# Patient Record
Sex: Female | Born: 1980
Health system: Southern US, Community
[De-identification: ages and names within clinical notes are randomized; demographics above are authoritative.]

## PROBLEM LIST (undated history)

## (undated) ENCOUNTER — Ambulatory Visit: Admission: EM | Payer: BC Managed Care – PPO | Source: Home / Self Care

## (undated) DIAGNOSIS — R519 Headache, unspecified: Secondary | ICD-10-CM

## (undated) DIAGNOSIS — G932 Benign intracranial hypertension: Secondary | ICD-10-CM

## (undated) DIAGNOSIS — I10 Essential (primary) hypertension: Secondary | ICD-10-CM

## (undated) DIAGNOSIS — F419 Anxiety disorder, unspecified: Secondary | ICD-10-CM

## (undated) DIAGNOSIS — F411 Generalized anxiety disorder: Secondary | ICD-10-CM

## (undated) DIAGNOSIS — F3341 Major depressive disorder, recurrent, in partial remission: Secondary | ICD-10-CM

## (undated) DIAGNOSIS — G43909 Migraine, unspecified, not intractable, without status migrainosus: Secondary | ICD-10-CM

## (undated) DIAGNOSIS — F332 Major depressive disorder, recurrent severe without psychotic features: Secondary | ICD-10-CM

## (undated) DIAGNOSIS — N200 Calculus of kidney: Secondary | ICD-10-CM

## (undated) DIAGNOSIS — N289 Disorder of kidney and ureter, unspecified: Secondary | ICD-10-CM

## (undated) DIAGNOSIS — F410 Panic disorder [episodic paroxysmal anxiety] without agoraphobia: Secondary | ICD-10-CM

## (undated) DIAGNOSIS — Z0000 Encounter for general adult medical examination without abnormal findings: Principal | ICD-10-CM

## (undated) DIAGNOSIS — Z01419 Encounter for gynecological examination (general) (routine) without abnormal findings: Secondary | ICD-10-CM

## (undated) DIAGNOSIS — E876 Hypokalemia: Principal | ICD-10-CM

## (undated) DIAGNOSIS — F330 Major depressive disorder, recurrent, mild: Secondary | ICD-10-CM

## (undated) DIAGNOSIS — F331 Major depressive disorder, recurrent, moderate: Principal | ICD-10-CM

## (undated) HISTORY — PX: ABDOMINAL HYSTERECTOMY: SHX81

## (undated) HISTORY — DX: Calculus of kidney: N20.0

---
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
## 2004-10-20 ENCOUNTER — Ambulatory Visit (HOSPITAL_COMMUNITY): Admission: RE | Admit: 2004-10-20 | Discharge: 2004-10-20 | Payer: Self-pay | Admitting: Family Medicine

## 2004-12-30 ENCOUNTER — Other Ambulatory Visit: Admission: RE | Admit: 2004-12-30 | Discharge: 2004-12-30 | Payer: Self-pay | Admitting: Obstetrics and Gynecology

## 2006-01-10 ENCOUNTER — Other Ambulatory Visit: Admission: RE | Admit: 2006-01-10 | Discharge: 2006-01-10 | Payer: Self-pay | Admitting: Obstetrics and Gynecology

## 2007-08-12 ENCOUNTER — Inpatient Hospital Stay (HOSPITAL_COMMUNITY): Admission: RE | Admit: 2007-08-12 | Discharge: 2007-08-14 | Payer: Self-pay | Admitting: Obstetrics & Gynecology

## 2008-11-18 ENCOUNTER — Inpatient Hospital Stay (HOSPITAL_COMMUNITY): Admission: RE | Admit: 2008-11-18 | Discharge: 2008-11-20 | Payer: Self-pay | Admitting: Obstetrics and Gynecology

## 2008-11-19 ENCOUNTER — Encounter (INDEPENDENT_AMBULATORY_CARE_PROVIDER_SITE_OTHER): Payer: Self-pay | Admitting: Obstetrics & Gynecology

## 2010-10-11 ENCOUNTER — Ambulatory Visit (HOSPITAL_COMMUNITY): Admission: RE | Admit: 2010-10-11 | Discharge: 2010-10-12 | Payer: Self-pay | Admitting: Obstetrics and Gynecology

## 2010-10-11 ENCOUNTER — Encounter (INDEPENDENT_AMBULATORY_CARE_PROVIDER_SITE_OTHER): Payer: Self-pay | Admitting: Obstetrics and Gynecology

## 2011-02-21 LAB — PREGNANCY, URINE: Preg Test, Ur: NEGATIVE

## 2011-02-21 LAB — CBC
HCT: 25.1 % — ABNORMAL LOW (ref 36.0–46.0)
Hemoglobin: 8.7 g/dL — ABNORMAL LOW (ref 12.0–15.0)
MCH: 30.4 pg (ref 26.0–34.0)
MCHC: 34.5 g/dL (ref 30.0–36.0)
MCV: 88 fL (ref 78.0–100.0)
Platelets: 190 10*3/uL (ref 150–400)
RBC: 2.86 MIL/uL — ABNORMAL LOW (ref 3.87–5.11)
RDW: 13.4 % (ref 11.5–15.5)
WBC: 11.5 10*3/uL — ABNORMAL HIGH (ref 4.0–10.5)

## 2011-02-22 LAB — CBC
HCT: 34.7 % — ABNORMAL LOW (ref 36.0–46.0)
Hemoglobin: 11.9 g/dL — ABNORMAL LOW (ref 12.0–15.0)
MCH: 30 pg (ref 26.0–34.0)
MCHC: 34.2 g/dL (ref 30.0–36.0)
MCV: 87.6 fL (ref 78.0–100.0)
Platelets: 248 10*3/uL (ref 150–400)
RBC: 3.96 MIL/uL (ref 3.87–5.11)
RDW: 13.4 % (ref 11.5–15.5)
WBC: 5.4 10*3/uL (ref 4.0–10.5)

## 2011-02-22 LAB — SURGICAL PCR SCREEN
MRSA, PCR: NEGATIVE
Staphylococcus aureus: NEGATIVE

## 2011-04-25 NOTE — Op Note (Signed)
NAMESHANEIKA, ROSSA                  ACCOUNT NO.:  [id number]   MEDICAL RECORD NO.:  [id number]          PATIENT TYPE:  INP   LOCATION:  9130                          FACILITY:  [facility]   PHYSICIAN:  Gerrit Friends. Aldona Bar, M.D.   DATE OF BIRTH:  04-Aug-1981   DATE OF PROCEDURE:  11/18/2008  DATE OF DISCHARGE:                               OPERATIVE REPORT   PREOPERATIVE DIAGNOSIS:  Postpartum, desire for permanent elective  sterilization.   POSTOPERATIVE DIAGNOSIS:  Postpartum, desire for permanent elective  sterilization, pathology pending.   PROCEDURE:  Postpartum tubal sterilization procedure per patient's  request.   SURGEON:  Gerrit Friends. Aldona Bar, MD   ANESTHESIA:  Epidural.   HISTORY:  This 30 year old, gravida 2 now para 2 was induced and  delivered on November 18, 2008, who requested a permanent elective  sterilization procedure.  She is aware that such procedure is meant to  be 100% permanent, but unfortunately it is not 100% perfect.  Subsequent  pregnancy is possible.  According to her wishes, she is now being taken  to the operating room for a postpartum tubal sterilization procedure.   PROCEDURE IN DETAIL:  The patient's epidural was augmented once she  arrived in the operating room and after she was prepped and draped with  a bladder drained with a red rubber catheter in an in-and-out fashion.  Epidural was found to be adequate for anesthesia and procedure was  begun.   A 2-cm subumbilical midline transverse skin incision was made and with  minimal difficulty, dissected down sharply to and through the fascia and  peritoneum.  The dome of the uterus felt normal.  The right ovary and  left ovary likewise felt normal.  The right fallopian tube was  identified and traced out the fimbriated end for positive  identification, and then the midportion of the right fallopian tube.  A  knuckle was elevated and a single tie of #1 plain catgut suture tied  down about the knuckle of tube and  the knuckle was excised and sent to  pathology.  Hemostasis was adequate.  A similar procedure was carried  out on the left fallopian tube.  At this time, with no intra-abdominal  pathology appreciated, closure of the abdomen was begun after all counts  were noted to be correct, and no foreign bodies were noted to be  remained in abdominal cavity.  The abdominal peritoneum was closed with  0 Vicryl in a running fashion.  Fascia was closed with 0 Vicryl in  interrupted fashion, and skin was closed with 4-0 Vicryl in a running  fashion.  Dressing was applied and the patient was transported to the  recovery in satisfactory condition having tolerated the procedure well.  Pathologic specimen consisted of segment of each fallopian tube.  All  counts were correct x2.  Estimated blood loss negligible.      Gerrit Friends. Aldona Bar, M.D.  Electronically Signed    RMW/MEDQ  D:  11/19/2008  T:  11/19/2008  Job:  [id number]

## 2011-09-15 LAB — CBC
HCT: 26.9 % — ABNORMAL LOW (ref 36.0–46.0)
HCT: 33.5 % — ABNORMAL LOW (ref 36.0–46.0)
Hemoglobin: 11.6 g/dL — ABNORMAL LOW (ref 12.0–15.0)
Hemoglobin: 9.4 g/dL — ABNORMAL LOW (ref 12.0–15.0)
MCHC: 34.8 g/dL (ref 30.0–36.0)
MCHC: 35 g/dL (ref 30.0–36.0)
MCV: 89.5 fL (ref 78.0–100.0)
MCV: 91.2 fL (ref 78.0–100.0)
Platelets: 178 10*3/uL (ref 150–400)
Platelets: 213 10*3/uL (ref 150–400)
RBC: 2.95 MIL/uL — ABNORMAL LOW (ref 3.87–5.11)
RBC: 3.74 MIL/uL — ABNORMAL LOW (ref 3.87–5.11)
RDW: 13.3 % (ref 11.5–15.5)
RDW: 13.4 % (ref 11.5–15.5)
WBC: 5.8 10*3/uL (ref 4.0–10.5)
WBC: 9.9 10*3/uL (ref 4.0–10.5)

## 2011-09-15 LAB — RPR: RPR Ser Ql: NONREACTIVE

## 2011-09-15 LAB — CCBB MATERNAL DONOR DRAW

## 2011-09-22 LAB — CBC
HCT: 21.1 — ABNORMAL LOW
HCT: 31.6 — ABNORMAL LOW
Hemoglobin: 10.9 — ABNORMAL LOW
Hemoglobin: 7.5 — CL
MCHC: 34.4
MCHC: 35.4
MCV: 84.1
MCV: 84.3
Platelets: 201
Platelets: 237
RBC: 2.52 — ABNORMAL LOW
RBC: 3.75 — ABNORMAL LOW
RDW: 13.5
RDW: 13.6
WBC: 11.6 — ABNORMAL HIGH
WBC: 6.6

## 2011-09-22 LAB — HEMOGLOBIN AND HEMATOCRIT, BLOOD
HCT: 21.1 — ABNORMAL LOW
Hemoglobin: 7.4 — CL

## 2011-09-22 LAB — RPR: RPR Ser Ql: NONREACTIVE

## 2011-11-08 ENCOUNTER — Other Ambulatory Visit: Payer: Self-pay | Admitting: Obstetrics and Gynecology

## 2015-01-12 ENCOUNTER — Other Ambulatory Visit: Payer: Self-pay | Admitting: Obstetrics and Gynecology

## 2015-01-13 LAB — CYTOLOGY - PAP

## 2019-10-03 ENCOUNTER — Other Ambulatory Visit: Payer: Self-pay

## 2019-10-03 DIAGNOSIS — Z20822 Contact with and (suspected) exposure to covid-19: Secondary | ICD-10-CM

## 2019-10-05 LAB — NOVEL CORONAVIRUS, NAA: SARS-CoV-2, NAA: NOT DETECTED

## 2019-10-22 DIAGNOSIS — Z0000 Encounter for general adult medical examination without abnormal findings: Secondary | ICD-10-CM | POA: Diagnosis not present

## 2019-10-29 DIAGNOSIS — F419 Anxiety disorder, unspecified: Secondary | ICD-10-CM | POA: Diagnosis not present

## 2019-10-29 DIAGNOSIS — K5900 Constipation, unspecified: Secondary | ICD-10-CM | POA: Diagnosis not present

## 2019-10-29 DIAGNOSIS — Z0000 Encounter for general adult medical examination without abnormal findings: Secondary | ICD-10-CM | POA: Diagnosis not present

## 2019-11-17 DIAGNOSIS — K5900 Constipation, unspecified: Secondary | ICD-10-CM | POA: Diagnosis not present

## 2019-11-17 DIAGNOSIS — R1030 Lower abdominal pain, unspecified: Secondary | ICD-10-CM | POA: Diagnosis not present

## 2019-11-17 DIAGNOSIS — Z0000 Encounter for general adult medical examination without abnormal findings: Secondary | ICD-10-CM | POA: Diagnosis not present

## 2019-11-17 DIAGNOSIS — F411 Generalized anxiety disorder: Secondary | ICD-10-CM | POA: Diagnosis not present

## 2019-11-17 DIAGNOSIS — R310 Gross hematuria: Secondary | ICD-10-CM | POA: Diagnosis not present

## 2019-11-17 DIAGNOSIS — F419 Anxiety disorder, unspecified: Secondary | ICD-10-CM | POA: Diagnosis not present

## 2020-01-09 ENCOUNTER — Other Ambulatory Visit: Payer: Self-pay

## 2020-01-09 ENCOUNTER — Ambulatory Visit: Payer: BC Managed Care – PPO | Attending: Internal Medicine

## 2020-01-09 DIAGNOSIS — Z20822 Contact with and (suspected) exposure to covid-19: Secondary | ICD-10-CM | POA: Insufficient documentation

## 2020-01-10 LAB — NOVEL CORONAVIRUS, NAA: SARS-CoV-2, NAA: NOT DETECTED

## 2020-03-08 DIAGNOSIS — N390 Urinary tract infection, site not specified: Secondary | ICD-10-CM | POA: Diagnosis not present

## 2020-03-08 DIAGNOSIS — R310 Gross hematuria: Secondary | ICD-10-CM | POA: Diagnosis not present

## 2020-03-08 DIAGNOSIS — R1030 Lower abdominal pain, unspecified: Secondary | ICD-10-CM | POA: Diagnosis not present

## 2020-06-21 DIAGNOSIS — R310 Gross hematuria: Secondary | ICD-10-CM | POA: Diagnosis not present

## 2020-06-21 DIAGNOSIS — N202 Calculus of kidney with calculus of ureter: Secondary | ICD-10-CM | POA: Diagnosis not present

## 2020-06-21 DIAGNOSIS — R1030 Lower abdominal pain, unspecified: Secondary | ICD-10-CM | POA: Diagnosis not present

## 2020-06-21 DIAGNOSIS — N390 Urinary tract infection, site not specified: Secondary | ICD-10-CM | POA: Diagnosis not present

## 2020-07-21 ENCOUNTER — Other Ambulatory Visit: Payer: Self-pay

## 2020-07-21 ENCOUNTER — Encounter (HOSPITAL_COMMUNITY): Payer: Self-pay

## 2020-07-21 ENCOUNTER — Emergency Department (HOSPITAL_COMMUNITY): Payer: BC Managed Care – PPO

## 2020-07-21 ENCOUNTER — Emergency Department (HOSPITAL_COMMUNITY)
Admission: EM | Admit: 2020-07-21 | Discharge: 2020-07-21 | Disposition: A | Discharge status: Home / Self Care | Payer: BC Managed Care – PPO | Attending: Emergency Medicine | Admitting: Emergency Medicine

## 2020-07-21 DIAGNOSIS — N200 Calculus of kidney: Secondary | ICD-10-CM | POA: Insufficient documentation

## 2020-07-21 DIAGNOSIS — N1330 Unspecified hydronephrosis: Secondary | ICD-10-CM | POA: Diagnosis not present

## 2020-07-21 DIAGNOSIS — N201 Calculus of ureter: Secondary | ICD-10-CM | POA: Diagnosis not present

## 2020-07-21 DIAGNOSIS — N2882 Megaloureter: Secondary | ICD-10-CM | POA: Diagnosis not present

## 2020-07-21 DIAGNOSIS — R1032 Left lower quadrant pain: Secondary | ICD-10-CM | POA: Diagnosis not present

## 2020-07-21 HISTORY — DX: Disorder of kidney and ureter, unspecified: N28.9

## 2020-07-21 LAB — URINALYSIS, ROUTINE W REFLEX MICROSCOPIC
Bilirubin Urine: NEGATIVE
Glucose, UA: NEGATIVE mg/dL
Ketones, ur: NEGATIVE mg/dL
Leukocytes,Ua: NEGATIVE
Nitrite: NEGATIVE
Protein, ur: NEGATIVE mg/dL
Specific Gravity, Urine: 1.014 (ref 1.005–1.030)
pH: 6 (ref 5.0–8.0)

## 2020-07-21 MED ORDER — ONDANSETRON 4 MG PO TBDP
4.0000 mg | ORAL_TABLET | Freq: Once | ORAL | Status: DC
  Filled 2020-07-21: qty 1

## 2020-07-21 MED ORDER — TAMSULOSIN HCL 0.4 MG PO CAPS: 0.4000 mg | ORAL_CAPSULE | Freq: Every day | ORAL | 0 refills | Status: DC

## 2020-07-21 MED ORDER — ONDANSETRON HCL 4 MG PO TABS: 4.0000 mg | ORAL_TABLET | Freq: Three times a day (TID) | ORAL | 0 refills | Status: AC | PRN

## 2020-07-21 MED ORDER — KETOROLAC TROMETHAMINE 10 MG PO TABS: 10.0000 mg | ORAL_TABLET | Freq: Three times a day (TID) | ORAL | 0 refills | Status: AC | PRN

## 2020-07-21 MED ORDER — HYDROCODONE-ACETAMINOPHEN 5-325 MG PO TABS: 2.0000 | ORAL_TABLET | Freq: Four times a day (QID) | ORAL | 0 refills | Status: DC | PRN

## 2020-07-21 NOTE — Discharge Instructions (Signed)
Take Flomax daily to help encourage urination and flush out the kidney stone. Make sure stay well-hydrated water. Take Toradol as needed for mild to moderate pain.  You may add on Norco as needed for severe breakthrough pain.  Have caution, this make you tired or groggy.  Do not drive or operate machinery taking this medicine. You Zofran as needed for nausea or vomiting. Call your urologist that you have an appointment with to discuss your findings today of a large kidney stone.  Ask if you could have a sooner appointment.  If not, you may try the doctor listed below to set up a sooner follow-up. Return to the emergency room if you develop fevers, persistent vomiting, severe worsening pain, inability urinate, or any new, worsening, or concerning symptoms.

## 2020-07-21 NOTE — ED Triage Notes (Signed)
Pt reports has been having frequent kidney stones.  PT says woke up with left sided flank pain.  Took a tramadol around 0800.

## 2020-07-21 NOTE — ED Provider Notes (Signed)
[facility] EMERGENCY DEPARTMENT Provider Note   CSN: [id number] Arrival date & time: 07/21/20  1027     History Chief Complaint  Patient presents with  . Flank Pain    Shelby Parker is a 39 y.o. female presenting for evaluation of left flank pain.  Patient states she woke up at 6:00 this morning with severe left-sided flank pain.  She reports associated nausea and vomiting.  She has a history of similar symptoms about once a month over the past year.  She has been seen by her PCP for this, but had no imaging.  Thought to be due to kidney stones, she has a follow-up appointment with urology next month.  She denies fevers, chills, chest pain, shortness breath, cough, dysuria, hematuria, urinary frequency, or abnormal bowel movements.  She states she takes bupropion daily, no other medical problems or medicines.  She has a history of a partial hysterectomy, no other abdominal surgeries.  She took tramadol for her symptoms, which she states did not help immediately, but states at this time her pain and nausea are much improved from earlier this morning. She denies vaginal bleeding or d/c.    HPI     Past Medical History:  Diagnosis Date  . Renal disorder    kidney stones    There are no problems to display for this patient.   Past Surgical History:  Procedure Laterality Date  . ABDOMINAL HYSTERECTOMY       OB History   No obstetric history on file.     No family history on file.  Social History   Tobacco Use  . Smoking status: Never Smoker  . Smokeless tobacco: Never Used  Substance Use Topics  . Alcohol use: Never  . Drug use: Never    Home Medications Prior to Admission medications   Medication Sig Start Date End Date Taking? Authorizing Provider  buPROPion (WELLBUTRIN SR) 100 MG 12 hr tablet Take 100 mg by mouth every morning. 07/19/20  Yes [provider]  traMADol (ULTRAM) 50 MG tablet Take 50 mg by mouth 2 (two) times daily as needed for severe pain.   06/21/20  Yes [provider]  HYDROcodone-acetaminophen (NORCO/VICODIN) 5-325 MG tablet Take 2 tablets by mouth every 6 (six) hours as needed. 07/21/20   Tahra Hitzeman, PA-C  ketorolac (TORADOL) 10 MG tablet Take 1 tablet (10 mg total) by mouth every 8 (eight) hours as needed. 07/21/20   Rozalyn Osland, PA-C  ondansetron (ZOFRAN) 4 MG tablet Take 1 tablet (4 mg total) by mouth every 8 (eight) hours as needed for nausea or vomiting. 07/21/20   Merrin Mcvicker, PA-C  tamsulosin (FLOMAX) 0.4 MG CAPS capsule Take 1 capsule (0.4 mg total) by mouth daily. 07/21/20   Tywone Bembenek, PA-C    Allergies    Codeine and Hydrocodone  Review of Systems   Review of Systems  Gastrointestinal: Positive for vomiting.  Genitourinary: Positive for flank pain.  All other systems reviewed and are negative.   Physical Exam Updated Vital Signs BP 110/63 (BP Location: Left Arm)   Pulse 83   Temp 98.5 F (36.9 C) (Oral)   Resp 18   Ht 5\' 2"  (1.575 m)   Wt 63.5 kg   SpO2 100%   BMI 25.61 kg/m   Physical Exam Vitals and nursing note reviewed.  Constitutional:      General: She is not in acute distress.    Appearance: She is well-developed.     Comments: Resting in  the bed in no acute distress  HENT:     Head: Normocephalic and atraumatic.  Eyes:     Extraocular Movements: Extraocular movements intact.     Conjunctiva/sclera: Conjunctivae normal.     Pupils: Pupils are equal, round, and reactive to light.  Cardiovascular:     Rate and Rhythm: Normal rate and regular rhythm.     Pulses: Normal pulses.  Pulmonary:     Effort: Pulmonary effort is normal. No respiratory distress.     Breath sounds: Normal breath sounds. No wheezing.  Abdominal:     General: There is no distension.     Palpations: Abdomen is soft. There is no mass.     Tenderness: There is no abdominal tenderness. There is no right CVA tenderness, guarding or rebound.     Comments: No CVA tenderness on my exam,  the patient states when she first arrived to the ER she was tender to palpation of the back/flank.  No anterior abdominal tenderness.  No rigidity, guarding, distention.  Negative rebound.  No peritonitis.  Musculoskeletal:        General: Normal range of motion.     Cervical back: Normal range of motion and neck supple.  Skin:    General: Skin is warm and dry.     Capillary Refill: Capillary refill takes less than 2 seconds.  Neurological:     Mental Status: She is alert and oriented to person, place, and time.     ED Results / Procedures / Treatments   Labs (all labs ordered are listed, but only abnormal results are displayed) Labs Reviewed  URINALYSIS, ROUTINE W REFLEX MICROSCOPIC - Abnormal; Notable for the following components:      Result Value   Hgb urine dipstick MODERATE (*)    Bacteria, UA FEW (*)    All other components within normal limits    EKG None  Radiology CT Renal Stone Study  Result Date: 07/21/2020 CLINICAL DATA:  Left flank pain.  Kidney stones suspected. EXAM: CT ABDOMEN AND PELVIS WITHOUT CONTRAST TECHNIQUE: Multidetector CT imaging of the abdomen and pelvis was performed following the standard protocol without IV contrast. COMPARISON:  None. FINDINGS: Lower chest: Unremarkable Hepatobiliary: No focal abnormality in the liver on this study without intravenous contrast. Possible tiny stone or gallbladder polyp (axial 40/2 and coronal 36/5). No intrahepatic or extrahepatic biliary dilation. Pancreas: No focal mass lesion. No dilatation of the main duct. No intraparenchymal cyst. No peripancreatic edema. Spleen: No splenomegaly. No focal mass lesion. Adrenals/Urinary Tract: No adrenal nodule or mass. 3 x 4 mm nonobstructing stone identified lower pole right kidney. No right ureteral stone. No secondary changes in the right kidney or ureter. No stones are seen in the left kidney although there is mild to moderate left hydroureteronephrosis. Left ureter is dilated down  to the pelvis where a 2 x 4 x 8 mm distal left ureteral stone is identified (well demonstrated coronal 57/5 and visible on axial 74/2). Bladder is nondistended. Stomach/Bowel: Stomach is unremarkable. No gastric wall thickening. No evidence of outlet obstruction. Duodenum is normally positioned as is the ligament of Treitz. No small bowel wall thickening. No small bowel dilatation. No gross colonic mass. No colonic wall thickening. Vascular/Lymphatic: No abdominal aortic aneurysm. No abdominal aortic atherosclerotic calcification. There is no gastrohepatic or hepatoduodenal ligament lymphadenopathy. No retroperitoneal or mesenteric lymphadenopathy. No pelvic sidewall lymphadenopathy. Reproductive: Unremarkable. Other: No intraperitoneal free fluid. Musculoskeletal: No worrisome lytic or sclerotic osseous abnormality. IMPRESSION: 1. 2 x 4 x  8 mm distal left ureteral stone with mild to moderate left hydroureteronephrosis. 2. 3 x 4 mm nonobstructing stone lower pole right kidney. 3. Possible tiny stone or gallbladder polyp. Electronically Signed   By: Kennith Center M.D.   On: 07/21/2020 11:44    Procedures Procedures (including critical care time)  Medications Ordered in ED Medications - No data to display  ED Course  I have reviewed the triage vital signs and the nursing notes.  Pertinent labs & imaging results that were available during my care of the patient were reviewed by me and considered in my medical decision making (see chart for details).    MDM Rules/Calculators/A&P                          Patient presented for evaluation of left leg pain and vomiting.  On exam, patient was nontoxic.  Symptoms have mostly resolved by the time I saw patient.  She has a history of similar, but no imaging.  Likely kidney stone.  Also consider Pilo, though less likely without signs of infection.  Consider GI illness.  Will obtain CT renal, urine, and reassess.  CT renal shows stone in the left ureter with  mild hydro.  Urine without signs of infection.  Patient remains symptom-free on my repeat evaluation.  I discussed findings with patient.  Discussed Intermatic treatment as needed.  Encourage patient to follow-up with urology sooner than next month, as patient stone is up to 8 mm, may not pass.  Discussed return with signs of infection.  At this time, patient appears safe for discharge.  Return precautions given.  Patient states she understands and agrees to plan.  Final Clinical Impression(s) / ED Diagnoses Final diagnoses:  Kidney stone on left side    Rx / DC Orders ED Discharge Orders         Ordered    HYDROcodone-acetaminophen (NORCO/VICODIN) 5-325 MG tablet  Every 6 hours PRN     Discontinue  Reprint     07/21/20 1348    ketorolac (TORADOL) 10 MG tablet  Every 8 hours PRN     Discontinue  Reprint     07/21/20 1348    ondansetron (ZOFRAN) 4 MG tablet  Every 8 hours PRN     Discontinue  Reprint     07/21/20 1348    tamsulosin (FLOMAX) 0.4 MG CAPS capsule  Daily     Discontinue  Reprint     07/21/20 1349           Saira Kramme, PA-C 07/21/20 1408    Geoffery Lyons, MD 07/21/20 1501

## 2020-07-22 ENCOUNTER — Other Ambulatory Visit (HOSPITAL_COMMUNITY): Payer: BC Managed Care – PPO

## 2020-07-22 ENCOUNTER — Ambulatory Visit (INDEPENDENT_AMBULATORY_CARE_PROVIDER_SITE_OTHER): Payer: BC Managed Care – PPO | Admitting: Urology

## 2020-07-22 ENCOUNTER — Other Ambulatory Visit: Payer: Self-pay

## 2020-07-22 ENCOUNTER — Other Ambulatory Visit: Payer: Self-pay | Admitting: Urology

## 2020-07-22 ENCOUNTER — Encounter: Payer: Self-pay | Admitting: Urology

## 2020-07-22 ENCOUNTER — Ambulatory Visit (HOSPITAL_COMMUNITY)
Admission: RE | Admit: 2020-07-22 | Discharge: 2020-07-22 | Disposition: A | Discharge status: Home / Self Care | Payer: BC Managed Care – PPO | Source: Ambulatory Visit | Attending: Urology | Admitting: Urology

## 2020-07-22 VITALS — BP 101/68 | HR 76 | Temp 98.7°F | Wt 140.0 lb

## 2020-07-22 DIAGNOSIS — N200 Calculus of kidney: Secondary | ICD-10-CM | POA: Diagnosis not present

## 2020-07-22 DIAGNOSIS — I878 Other specified disorders of veins: Secondary | ICD-10-CM | POA: Diagnosis not present

## 2020-07-22 DIAGNOSIS — Z87442 Personal history of urinary calculi: Secondary | ICD-10-CM | POA: Diagnosis not present

## 2020-07-22 LAB — URINALYSIS, ROUTINE W REFLEX MICROSCOPIC
Bilirubin, UA: NEGATIVE
Glucose, UA: NEGATIVE
Ketones, UA: NEGATIVE
Leukocytes,UA: NEGATIVE
Nitrite, UA: NEGATIVE
Protein,UA: NEGATIVE
Specific Gravity, UA: 1.025 (ref 1.005–1.030)
Urobilinogen, Ur: 0.2 mg/dL (ref 0.2–1.0)
pH, UA: 5.5 (ref 5.0–7.5)

## 2020-07-22 LAB — MICROSCOPIC EXAMINATION
Epithelial Cells (non renal): >10 /hpf — AB (ref 0–10)
Renal Epithel, UA: NONE SEEN /hpf

## 2020-07-22 MED ORDER — TAMSULOSIN HCL 0.4 MG PO CAPS: 0.4000 mg | ORAL_CAPSULE | Freq: Every day | ORAL | 0 refills | Status: AC

## 2020-07-22 MED ORDER — OXYCODONE-ACETAMINOPHEN 5-325 MG PO TABS: 1.0000 | ORAL_TABLET | ORAL | 0 refills | Status: AC | PRN

## 2020-07-22 NOTE — Progress Notes (Signed)
Patient to arrive at 0915 on 07/26/2020. History and medications reviewed. All pre-procedure instructions given.  Instructed to stop toradol 48 hours prior to procedure.  NPO after MN. Driver secured.

## 2020-07-22 NOTE — H&P (View-Only) (Signed)
° °07/22/2020 °9:36 AM  ° °Emberlie C Reidel °03/05/1981 °[id number] ° °Referring provider: Hall, John Z, MD °[address] °[address] °[place],  [place] [address] ° °Left flank pain ° °HPI: °Ms Shelby Parker is a 39yo here for evaluation of nephrolithiasis.  °1 month she developed left flank pain and was diagnosed with a left ureteral calculus. She then developed severe left flank pain and presented to the ER and was diagnosed with a 9mm left mid ureteral calculus. Her first stone event was 5 years ago. Currently she has mild left flank pain with occasional nausea.  ° °PMH: °Past Medical History:  °Diagnosis Date  °• Nephrolithiasis   °• Renal disorder   ° kidney stones  ° ° °Surgical History: °Past Surgical History:  °Procedure Laterality Date  °• ABDOMINAL HYSTERECTOMY    ° ° °Home Medications:  °Allergies as of 07/22/2020   °   Reactions  ° Codeine   ° Hydrocodone   °  °  °Medication List  °  °  ° Accurate as of July 22, 2020  9:36 AM. If you have any questions, ask your nurse or doctor.  °  °  °  °buPROPion 100 MG 12 hr tablet °Commonly known as: WELLBUTRIN SR °Take 100 mg by mouth every morning. °  °HYDROcodone-acetaminophen 5-325 MG tablet °Commonly known as: NORCO/VICODIN °Take 2 tablets by mouth every 6 (six) hours as needed. °  °ketorolac 10 MG tablet °Commonly known as: TORADOL °Take 1 tablet (10 mg total) by mouth every 8 (eight) hours as needed. °  °ondansetron 4 MG tablet °Commonly known as: ZOFRAN °Take 1 tablet (4 mg total) by mouth every 8 (eight) hours as needed for nausea or vomiting. °  °tamsulosin 0.4 MG Caps capsule °Commonly known as: Flomax °Take 1 capsule (0.4 mg total) by mouth daily. °  °traMADol 50 MG tablet °Commonly known as: ULTRAM °Take 50 mg by mouth 2 (two) times daily as needed for severe pain. °  °  ° ° °Allergies:  °Allergies  °Allergen Reactions  °• Codeine   °• Hydrocodone   ° ° °Family History: °History reviewed. No pertinent family history. ° °Social History:  reports that she has never smoked.  She has never used smokeless tobacco. She reports that she does not drink alcohol and does not use drugs. ° °ROS: °All other review of systems were reviewed and are negative except what is noted above in HPI ° °Physical Exam: °BP 101/68    Pulse 76    Temp 98.7 °F (37.1 °C)    Wt 140 lb (63.5 kg)    BMI 25.61 kg/m²   °Constitutional:  Alert and oriented, No acute distress. °HEENT: [place] AT, moist mucus membranes.  Trachea midline, no masses. °Cardiovascular: No clubbing, cyanosis, or edema. °Respiratory: Normal respiratory effort, no increased work of breathing. °GI: Abdomen is soft, nontender, nondistended, no abdominal masses °GU: No CVA tenderness.  °Lymph: No cervical or inguinal lymphadenopathy. °Skin: No rashes, bruises or suspicious lesions. °Neurologic: Grossly intact, no focal deficits, moving all 4 extremities. °Psychiatric: Normal mood and affect. ° °Laboratory Data: °Lab Results  °Component Value Date  ° WBC 11.5 (H) 10/12/2010  ° HGB 8.7 (L) 10/12/2010  ° HCT 25.1 (L) 10/12/2010  ° MCV 88.0 10/12/2010  ° PLT 190 10/12/2010  ° ° °No results found for: CREATININE ° °No results found for: PSA ° °No results found for: TESTOSTERONE ° °No results found for: HGBA1C ° °Urinalysis °   °Component Value Date/Time  °   COLORURINE YELLOW 07/21/2020 1312  ° APPEARANCEUR CLEAR 07/21/2020 1312  ° LABSPEC 1.014 07/21/2020 1312  ° PHURINE 6.0 07/21/2020 1312  ° GLUCOSEU NEGATIVE 07/21/2020 1312  ° HGBUR MODERATE (A) 07/21/2020 1312  ° BILIRUBINUR NEGATIVE 07/21/2020 1312  ° KETONESUR NEGATIVE 07/21/2020 1312  ° PROTEINUR NEGATIVE 07/21/2020 1312  ° NITRITE NEGATIVE 07/21/2020 1312  ° LEUKOCYTESUR NEGATIVE 07/21/2020 1312  ° ° °Lab Results  °Component Value Date  ° BACTERIA FEW (A) 07/21/2020  ° ° °Pertinent Imaging: °Ct stone study 07/21/2020: Images reviewed and discussed with the patient.  °No results found for this or any previous visit. ° °No results found for this or any previous visit. ° °No results found for this or any  previous visit. ° °No results found for this or any previous visit. ° °No results found for this or any previous visit. ° °No results found for this or any previous visit. ° °No results found for this or any previous visit. ° °Results for orders placed during the hospital encounter of 07/21/20 ° °CT Renal Stone Study ° °Narrative °CLINICAL DATA:  Left flank pain.  Kidney stones suspected. ° °EXAM: °CT ABDOMEN AND PELVIS WITHOUT CONTRAST ° °TECHNIQUE: °Multidetector CT imaging of the abdomen and pelvis was performed °following the standard protocol without IV contrast. ° °COMPARISON:  None. ° °FINDINGS: °Lower chest: Unremarkable ° °Hepatobiliary: No focal abnormality in the liver on this study °without intravenous contrast. Possible tiny stone or gallbladder °polyp (axial 40/2 and coronal 36/5). No intrahepatic or extrahepatic °biliary dilation. ° °Pancreas: No focal mass lesion. No dilatation of the main duct. No °intraparenchymal cyst. No peripancreatic edema. ° °Spleen: No splenomegaly. No focal mass lesion. ° °Adrenals/Urinary Tract: No adrenal nodule or mass. 3 x 4 mm °nonobstructing stone identified lower pole right kidney. No right °ureteral stone. No secondary changes in the right kidney or ureter. °No stones are seen in the left kidney although there is mild to °moderate left hydroureteronephrosis. Left ureter is dilated down to °the pelvis where a 2 x 4 x 8 mm distal left ureteral stone is °identified (well demonstrated coronal 57/5 and visible on axial °74/2). Bladder is nondistended. ° °Stomach/Bowel: Stomach is unremarkable. No gastric wall thickening. °No evidence of outlet obstruction. Duodenum is normally positioned °as is the ligament of Treitz. No small bowel wall thickening. No °small bowel dilatation. No gross colonic mass. No colonic wall °thickening. ° °Vascular/Lymphatic: No abdominal aortic aneurysm. No abdominal °aortic atherosclerotic calcification. There is no gastrohepatic  or °hepatoduodenal ligament lymphadenopathy. No retroperitoneal or °mesenteric lymphadenopathy. No pelvic sidewall lymphadenopathy. ° °Reproductive: Unremarkable. ° °Other: No intraperitoneal free fluid. ° °Musculoskeletal: No worrisome lytic or sclerotic osseous °abnormality. ° °IMPRESSION: °1. 2 x 4 x 8 mm distal left ureteral stone with mild to moderate °left hydroureteronephrosis. °2. 3 x 4 mm nonobstructing stone lower pole right kidney. °3. Possible tiny stone or gallbladder polyp. ° ° °Electronically Signed °By: Eric  Mansell M.D. °On: 07/21/2020 11:44 ° ° °Assessment & Plan:   ° °1. Nephrolithiasis °-We discussed the management of kidney stones. These options include observation, ureteroscopy, shockwave lithotripsy (ESWL) and percutaneous nephrolithotomy (PCNL). We discussed which options are relevant to the patient's stone(s). We discussed the natural history of kidney stones as well as the complications of untreated stones and the impact on quality of life without treatment as well as with each of the above listed treatments. We also discussed the efficacy of each treatment in its ability to clear the stone burden. With any of   these management options I discussed the signs and symptoms of infection and the need for emergent treatment should these be experienced. For each option we discussed the ability of each procedure to clear the patient of their stone burden.  ° °For observation I described the risks which include but are not limited to silent renal damage, life-threatening infection, need for emergent surgery, failure to pass stone and pain.  ° °For ureteroscopy I described the risks which include bleeding, infection, damage to contiguous structures, positioning injury, ureteral stricture, ureteral avulsion, ureteral injury, need for prolonged ureteral stent, inability to perform ureteroscopy, need for an interval procedure, inability to clear stone burden, stent discomfort/pain, heart attack, stroke,  pulmonary embolus and the inherent risks with general anesthesia.  ° °For shockwave lithotripsy I described the risks which include arrhythmia, kidney contusion, kidney hemorrhage, need for transfusion, pain, inability to adequately break up stone, inability to pass stone fragments, Steinstrasse, infection associated with obstructing stones, need for alternate surgical procedure, need for repeat shockwave lithotripsy, MI, CVA, PE and the inherent risks with anesthesia/conscious sedation.  ° °For PCNL I described the risks including positioning injury, pneumothorax, hydrothorax, need for chest tube, inability to clear stone burden, renal laceration, arterial venous fistula or malformation, need for embolization of kidney, loss of kidney or renal function, need for repeat procedure, need for prolonged nephrostomy tube, ureteral avulsion, MI, CVA, PE and the inherent risks of general anesthesia.  ° °- The patient would like to proceed with ESWL. Rx for percocet, flomax given  °- Urinalysis, Routine w reflex microscopic ° ° °No follow-ups on file. ° °Jarrah Babich, MD ° °[facility] ° °

## 2020-07-22 NOTE — Progress Notes (Signed)
07/22/2020 9:36 AM   Shelby Parker 01/10/1981 [id number]  Referring provider: Benita Stabile, MD [address] [name],  [place] [address]  Left flank pain  HPI: [name] is a 39yo here for evaluation of nephrolithiasis.  1 month she developed left flank pain and was diagnosed with a left ureteral calculus. She then developed severe left flank pain and presented to the ER and was diagnosed with a 39mm left mid ureteral calculus. Her first stone event was 5 years ago. Currently she has mild left flank pain with occasional nausea.   PMH: Past Medical History:  Diagnosis Date   Nephrolithiasis    Renal disorder    kidney stones    Surgical History: Past Surgical History:  Procedure Laterality Date   ABDOMINAL HYSTERECTOMY      Home Medications:  Allergies as of 07/22/2020      Reactions   Codeine    Hydrocodone       Medication List       Accurate as of July 22, 2020  9:36 AM. If you have any questions, ask your nurse or doctor.        buPROPion 100 MG 12 hr tablet Commonly known as: WELLBUTRIN SR Take 100 mg by mouth every morning.   HYDROcodone-acetaminophen 5-325 MG tablet Commonly known as: NORCO/VICODIN Take 2 tablets by mouth every 6 (six) hours as needed.   ketorolac 10 MG tablet Commonly known as: TORADOL Take 1 tablet (10 mg total) by mouth every 8 (eight) hours as needed.   ondansetron 4 MG tablet Commonly known as: ZOFRAN Take 1 tablet (4 mg total) by mouth every 8 (eight) hours as needed for nausea or vomiting.   tamsulosin 0.4 MG Caps capsule Commonly known as: Flomax Take 1 capsule (0.4 mg total) by mouth daily.   traMADol 50 MG tablet Commonly known as: ULTRAM Take 50 mg by mouth 2 (two) times daily as needed for severe pain.       Allergies:  Allergies  Allergen Reactions   Codeine    Hydrocodone     Family History: History reviewed. No pertinent family history.  Social History:  reports that she has never smoked.  She has never used smokeless tobacco. She reports that she does not drink alcohol and does not use drugs.  ROS: All other review of systems were reviewed and are negative except what is noted above in HPI  Physical Exam: BP 101/68    Pulse 76    Temp 98.7 F (37.1 C)    Wt 140 lb (63.5 kg)    BMI 25.61 kg/m   Constitutional:  Alert and oriented, No acute distress. HEENT: [place] AT, moist mucus membranes.  Trachea midline, no masses. Cardiovascular: No clubbing, cyanosis, or edema. Respiratory: Normal respiratory effort, no increased work of breathing. GI: Abdomen is soft, nontender, nondistended, no abdominal masses GU: No CVA tenderness.  Lymph: No cervical or inguinal lymphadenopathy. Skin: No rashes, bruises or suspicious lesions. Neurologic: Grossly intact, no focal deficits, moving all 4 extremities. Psychiatric: Normal mood and affect.  Laboratory Data: Lab Results  Component Value Date   WBC 11.5 (H) 10/12/2010   HGB 8.7 (L) 10/12/2010   HCT 25.1 (L) 10/12/2010   MCV 88.0 10/12/2010   PLT 190 10/12/2010    No results found for: CREATININE  No results found for: PSA  No results found for: TESTOSTERONE  No results found for: HGBA1C  Urinalysis    Component Value Date/Time  COLORURINE YELLOW 07/21/2020 1312   APPEARANCEUR CLEAR 07/21/2020 1312   LABSPEC 1.014 07/21/2020 1312   PHURINE 6.0 07/21/2020 1312   GLUCOSEU NEGATIVE 07/21/2020 1312   HGBUR MODERATE (A) 07/21/2020 1312   BILIRUBINUR NEGATIVE 07/21/2020 1312   KETONESUR NEGATIVE 07/21/2020 1312   PROTEINUR NEGATIVE 07/21/2020 1312   NITRITE NEGATIVE 07/21/2020 1312   LEUKOCYTESUR NEGATIVE 07/21/2020 1312    Lab Results  Component Value Date   BACTERIA FEW (A) 07/21/2020    Pertinent Imaging: Ct stone study 07/21/2020: Images reviewed and discussed with the patient.  No results found for this or any previous visit.  No results found for this or any previous visit.  No results found for this or any  previous visit.  No results found for this or any previous visit.  No results found for this or any previous visit.  No results found for this or any previous visit.  No results found for this or any previous visit.  Results for orders placed during the hospital encounter of 07/21/20  CT Renal Stone Study  Narrative CLINICAL DATA:  Left flank pain.  Kidney stones suspected.  EXAM: CT ABDOMEN AND PELVIS WITHOUT CONTRAST  TECHNIQUE: Multidetector CT imaging of the abdomen and pelvis was performed following the standard protocol without IV contrast.  COMPARISON:  None.  FINDINGS: Lower chest: Unremarkable  Hepatobiliary: No focal abnormality in the liver on this study without intravenous contrast. Possible tiny stone or gallbladder polyp (axial 40/2 and coronal 36/5). No intrahepatic or extrahepatic biliary dilation.  Pancreas: No focal mass lesion. No dilatation of the main duct. No intraparenchymal cyst. No peripancreatic edema.  Spleen: No splenomegaly. No focal mass lesion.  Adrenals/Urinary Tract: No adrenal nodule or mass. 3 x 4 mm nonobstructing stone identified lower pole right kidney. No right ureteral stone. No secondary changes in the right kidney or ureter. No stones are seen in the left kidney although there is mild to moderate left hydroureteronephrosis. Left ureter is dilated down to the pelvis where a 2 x 4 x 8 mm distal left ureteral stone is identified (well demonstrated coronal 57/5 and visible on axial 74/2). Bladder is nondistended.  Stomach/Bowel: Stomach is unremarkable. No gastric wall thickening. No evidence of outlet obstruction. Duodenum is normally positioned as is the ligament of Treitz. No small bowel wall thickening. No small bowel dilatation. No gross colonic mass. No colonic wall thickening.  Vascular/Lymphatic: No abdominal aortic aneurysm. No abdominal aortic atherosclerotic calcification. There is no gastrohepatic  or hepatoduodenal ligament lymphadenopathy. No retroperitoneal or mesenteric lymphadenopathy. No pelvic sidewall lymphadenopathy.  Reproductive: Unremarkable.  Other: No intraperitoneal free fluid.  Musculoskeletal: No worrisome lytic or sclerotic osseous abnormality.  IMPRESSION: 1. 2 x 4 x 8 mm distal left ureteral stone with mild to moderate left hydroureteronephrosis. 2. 3 x 4 mm nonobstructing stone lower pole right kidney. 3. Possible tiny stone or gallbladder polyp.   Electronically Signed By: Kennith Center M.D. On: 07/21/2020 11:44   Assessment & Plan:    1. Nephrolithiasis -We discussed the management of kidney stones. These options include observation, ureteroscopy, shockwave lithotripsy (ESWL) and percutaneous nephrolithotomy (PCNL). We discussed which options are relevant to the patient's stone(s). We discussed the natural history of kidney stones as well as the complications of untreated stones and the impact on quality of life without treatment as well as with each of the above listed treatments. We also discussed the efficacy of each treatment in its ability to clear the stone burden. With any of  these management options I discussed the signs and symptoms of infection and the need for emergent treatment should these be experienced. For each option we discussed the ability of each procedure to clear the patient of their stone burden.   For observation I described the risks which include but are not limited to silent renal damage, life-threatening infection, need for emergent surgery, failure to pass stone and pain.   For ureteroscopy I described the risks which include bleeding, infection, damage to contiguous structures, positioning injury, ureteral stricture, ureteral avulsion, ureteral injury, need for prolonged ureteral stent, inability to perform ureteroscopy, need for an interval procedure, inability to clear stone burden, stent discomfort/pain, heart attack, stroke,  pulmonary embolus and the inherent risks with general anesthesia.   For shockwave lithotripsy I described the risks which include arrhythmia, kidney contusion, kidney hemorrhage, need for transfusion, pain, inability to adequately break up stone, inability to pass stone fragments, Steinstrasse, infection associated with obstructing stones, need for alternate surgical procedure, need for repeat shockwave lithotripsy, MI, CVA, PE and the inherent risks with anesthesia/conscious sedation.   For PCNL I described the risks including positioning injury, pneumothorax, hydrothorax, need for chest tube, inability to clear stone burden, renal laceration, arterial venous fistula or malformation, need for embolization of kidney, loss of kidney or renal function, need for repeat procedure, need for prolonged nephrostomy tube, ureteral avulsion, MI, CVA, PE and the inherent risks of general anesthesia.   - The patient would like to proceed with ESWL. Rx for percocet, flomax given  - Urinalysis, Routine w reflex microscopic   No follow-ups on file.  Wilkie Aye, MD  [facility] [place]

## 2020-07-22 NOTE — Patient Instructions (Signed)
Lithotripsy  Lithotripsy is a treatment that can sometimes help eliminate kidney stones and the pain that they cause. A form of lithotripsy, also known as extracorporeal shock wave lithotripsy, is a nonsurgical procedure that crushes a kidney stone with shock waves. These shock waves pass through your body and focus on the kidney stone. They cause the kidney stone to break up while it is still in the urinary tract. This makes it easier for the smaller pieces of stone to pass in the urine. Tell a health care provider about:  Any allergies you have.  All medicines you are taking, including vitamins, herbs, eye drops, creams, and over-the-counter medicines.  Any blood disorders you have.  Any surgeries you have had.  Any medical conditions you have.  Whether you are pregnant or may be pregnant.  Any problems you or family members have had with anesthetic medicines. What are the risks? Generally, this is a safe procedure. However, problems may occur, including:  Infection.  Bleeding of the kidney.  Bruising of the kidney or skin.  Scarring of the kidney, which can lead to: ? Increased blood pressure. ? Poor kidney function. ? Return (recurrence) of kidney stones.  Damage to other structures or organs, such as the liver, colon, spleen, or pancreas.  Blockage (obstruction) of the the tube that carries urine from the kidney to the bladder (ureter).  Failure of the kidney stone to break into pieces (fragments). What happens before the procedure? Staying hydrated Follow instructions from your health care provider about hydration, which may include:  Up to 2 hours before the procedure - you may continue to drink clear liquids, such as water, clear fruit juice, black coffee, and plain tea. Eating and drinking restrictions Follow instructions from your health care provider about eating and drinking, which may include:  8 hours before the procedure - stop eating heavy meals or foods  such as meat, fried foods, or fatty foods.  6 hours before the procedure - stop eating light meals or foods, such as toast or cereal.  6 hours before the procedure - stop drinking milk or drinks that contain milk.  2 hours before the procedure - stop drinking clear liquids. General instructions  Plan to have someone take you home from the hospital or clinic.  Ask your health care provider about: ? Changing or stopping your regular medicines. This is especially important if you are taking diabetes medicines or blood thinners. ? Taking medicines such as aspirin and ibuprofen. These medicines and other NSAIDs can thin your blood. Do not take these medicines for 7 days before your procedure if your health care provider instructs you not to.  You may have tests, such as: ? Blood tests. ? Urine tests. ? Imaging tests, such as a CT scan. What happens during the procedure?  To lower your risk of infection: ? Your health care team will wash or sanitize their hands. ? Your skin will be washed with soap.  An IV tube will be inserted into one of your veins. This tube will give you fluids and medicines.  You will be given one or more of the following: ? A medicine to help you relax (sedative). ? A medicine to make you fall asleep (general anesthetic).  A water-filled cushion may be placed behind your kidney or on your abdomen. In some cases you may be placed in a tub of lukewarm water.  Your body will be positioned in a way that makes it easy to target the kidney   stone.  A flexible tube with holes in it (stent) may be placed in the ureter. This will help keep urine flowing from the kidney if the fragments of the stone have been blocking the ureter.  An X-ray or ultrasound exam will be done to locate your stone.  Shock waves will be aimed at the stone. If you are awake, you may feel a tapping sensation as the shock waves pass through your body. The procedure may vary among health care  providers and hospitals. What happens after the procedure?  You may have an X-ray to see whether the procedure was able to break up the kidney stone and how much of the stone has passed. If large stone fragments remain after treatment, you may need to have a second procedure at a later time.  Your blood pressure, heart rate, breathing rate, and blood oxygen level will be monitored until the medicines you were given have worn off.  You may be given antibiotics or pain medicine as needed.  If a stent was placed in your ureter during surgery, it may stay in place for a few weeks.  You may need strain your urine to collect pieces of the kidney stone for testing.  You will need to drink plenty of water.  Do not drive for 24 hours if you were given a sedative. Summary  Lithotripsy is a treatment that can sometimes help eliminate kidney stones and the pain that they cause.  A form of lithotripsy, also known as extracorporeal shock wave lithotripsy, is a nonsurgical procedure that crushes a kidney stone with shock waves.  Generally, this is a safe procedure. However, problems may occur, including damage to the kidney or other organs, infection, or obstruction of the tube that carries urine from the kidney to the bladder (ureter).  When you go home, you will need to drink plenty of water. You may be asked to strain your urine to collect pieces of the kidney stone for testing. This information is not intended to replace advice given to you by your health care provider. Make sure you discuss any questions you have with your health care provider. Document Revised: 03/10/2019 Document Reviewed: 10/18/2016 Elsevier Patient Education  2020 [employer] Kidney Stones Kidney stones are rock-like masses that form inside of the kidneys. Kidneys are organs that make pee (urine). A kidney stone may move into other parts of the urinary tract, including:  The tubes that connect the kidneys to the bladder  (ureters).  The bladder.  The tube that carries urine out of the body (urethra). Kidney stones can cause very bad pain and can block the flow of pee. The stone usually leaves your body (passes) through your pee. You may need to have a doctor take out the stone. What are the causes? Kidney stones may be caused by:  A condition in which certain glands make too much parathyroid hormone (primary hyperparathyroidism).  A buildup of a type of crystals in the bladder made of a chemical called uric acid. The body makes uric acid when you eat certain foods.  Narrowing (stricture) of one or both of the ureters.  A kidney blockage that you were born with.  Past surgery on the kidney or the ureters, such as gastric bypass surgery. What increases the risk? You are more likely to develop this condition if:  You have had a kidney stone in the past.  You have a family history of kidney stones.  You do not drink enough water.  You eat a diet that is high in protein, salt (sodium), or sugar.  You are overweight or very overweight (obese). What are the signs or symptoms? Symptoms of a kidney stone may include:  Pain in the side of the belly, right below the ribs (flank pain). Pain usually spreads (radiates) to the groin.  Needing to pee often or right away (urgently).  Pain when going pee (urinating).  Blood in your pee (hematuria).  Feeling like you may vomit (nauseous).  Vomiting.  Fever and chills. How is this treated? Treatment depends on the size, location, and makeup of the kidney stones. The stones will often pass out of the body through peeing. You may need to:  Drink more fluid to help pass the stone. In some cases, you may be given fluids through an IV tube put into one of your veins at the hospital.  Take medicine for pain.  Make changes in your diet to help keep kidney stones from coming back. Sometimes, medical procedures are needed to remove a kidney stone. This may  involve:  A procedure to break up kidney stones using a beam of light (laser) or shock waves.  Surgery to remove the kidney stones. Follow these instructions at home: Medicines  Take over-the-counter and prescription medicines only as told by your doctor.  Ask your doctor if the medicine prescribed to you requires you to avoid driving or using heavy machinery. Eating and drinking  Drink enough fluid to keep your pee pale yellow. You may be told to drink at least 8-10 glasses of water each day. This will help you pass the stone.  If told by your doctor, change your diet. This may include: ? Limiting how much salt you eat. ? Eating more fruits and vegetables. ? Limiting how much meat, poultry, fish, and eggs you eat.  Follow instructions from your doctor about eating or drinking restrictions. General instructions  Collect pee samples as told by your doctor. You may need to collect a pee sample: ? 24 hours after a stone comes out. ? 8-12 weeks after a stone comes out, and every 6-12 months after that.  Strain your pee every time you pee (urinate), for as long as told. Use the strainer that your doctor recommends.  Do not throw out the stone. Keep it so that it can be tested by your doctor.  Keep all follow-up visits as told by your doctor. This is important. You may need follow-up tests. How is this prevented? To prevent another kidney stone:  Drink enough fluid to keep your pee pale yellow. This is the best way to prevent kidney stones.  Eat healthy foods.  Avoid certain foods as told by your doctor. You may be told to eat less protein.  Stay at a healthy weight. Where to find more information  National Kidney Foundation (NKF): www.kidney.org  [facility]): www.urologyhealth.org Contact a doctor if:  You have pain that gets worse or does not get better with medicine. Get help right away if:  You have a fever or chills.  You get very bad  pain.  You get new pain in your belly (abdomen).  You pass out (faint).  You cannot pee. Summary  Kidney stones are rock-like masses that form inside of the kidneys.  Kidney stones can cause very bad pain and can block the flow of pee.  The stones will often pass out of the body through peeing.  Drink enough fluid to keep your pee pale yellow.  This information is not intended to replace advice given to you by your health care provider. Make sure you discuss any questions you have with your health care provider. Document Revised: 04/15/2019 Document Reviewed: 04/15/2019 Elsevier Patient Education  2020 [employer].

## 2020-07-22 NOTE — Progress Notes (Signed)

## 2020-07-23 ENCOUNTER — Other Ambulatory Visit (HOSPITAL_COMMUNITY)
Admission: RE | Admit: 2020-07-23 | Discharge: 2020-07-23 | Disposition: A | Discharge status: Home / Self Care | Payer: BC Managed Care – PPO | Source: Ambulatory Visit | Attending: Urology | Admitting: Urology

## 2020-07-23 DIAGNOSIS — Z20822 Contact with and (suspected) exposure to covid-19: Secondary | ICD-10-CM | POA: Insufficient documentation

## 2020-07-23 DIAGNOSIS — Z01812 Encounter for preprocedural laboratory examination: Secondary | ICD-10-CM | POA: Insufficient documentation

## 2020-07-23 DIAGNOSIS — Z79899 Other long term (current) drug therapy: Secondary | ICD-10-CM | POA: Diagnosis not present

## 2020-07-23 DIAGNOSIS — N201 Calculus of ureter: Secondary | ICD-10-CM | POA: Diagnosis not present

## 2020-07-23 DIAGNOSIS — N132 Hydronephrosis with renal and ureteral calculous obstruction: Secondary | ICD-10-CM | POA: Diagnosis not present

## 2020-07-23 DIAGNOSIS — Z885 Allergy status to narcotic agent status: Secondary | ICD-10-CM | POA: Diagnosis not present

## 2020-07-23 LAB — SARS CORONAVIRUS 2 (TAT 6-24 HRS): SARS Coronavirus 2: NEGATIVE

## 2020-07-26 ENCOUNTER — Ambulatory Visit (HOSPITAL_COMMUNITY): Payer: BC Managed Care – PPO

## 2020-07-26 ENCOUNTER — Other Ambulatory Visit: Payer: Self-pay

## 2020-07-26 ENCOUNTER — Ambulatory Visit (HOSPITAL_BASED_OUTPATIENT_CLINIC_OR_DEPARTMENT_OTHER)
Admission: RE | Admit: 2020-07-26 | Discharge: 2020-07-26 | Disposition: A | Discharge status: Home / Self Care | Payer: BC Managed Care – PPO | Attending: Urology | Admitting: Urology

## 2020-07-26 ENCOUNTER — Encounter (HOSPITAL_BASED_OUTPATIENT_CLINIC_OR_DEPARTMENT_OTHER)
Admission: RE | Disposition: A | Discharge status: Home / Self Care | Payer: Self-pay | Source: Home / Self Care | Attending: Urology

## 2020-07-26 ENCOUNTER — Encounter (HOSPITAL_BASED_OUTPATIENT_CLINIC_OR_DEPARTMENT_OTHER): Payer: Self-pay | Admitting: Urology

## 2020-07-26 DIAGNOSIS — N201 Calculus of ureter: Secondary | ICD-10-CM | POA: Diagnosis not present

## 2020-07-26 DIAGNOSIS — Z79899 Other long term (current) drug therapy: Secondary | ICD-10-CM | POA: Insufficient documentation

## 2020-07-26 DIAGNOSIS — I878 Other specified disorders of veins: Secondary | ICD-10-CM | POA: Diagnosis not present

## 2020-07-26 DIAGNOSIS — Z885 Allergy status to narcotic agent status: Secondary | ICD-10-CM | POA: Diagnosis not present

## 2020-07-26 DIAGNOSIS — N132 Hydronephrosis with renal and ureteral calculous obstruction: Secondary | ICD-10-CM | POA: Insufficient documentation

## 2020-07-26 DIAGNOSIS — N202 Calculus of kidney with calculus of ureter: Secondary | ICD-10-CM | POA: Diagnosis not present

## 2020-07-26 HISTORY — PX: EXTRACORPOREAL SHOCK WAVE LITHOTRIPSY: SHX1557

## 2020-07-26 SURGERY — LITHOTRIPSY, ESWL
Anesthesia: LOCAL | Laterality: Left

## 2020-07-26 MED ORDER — SODIUM CHLORIDE 0.9% FLUSH: 3.0000 mL | Freq: Two times a day (BID) | INTRAVENOUS | Status: DC

## 2020-07-26 MED ORDER — DIPHENHYDRAMINE HCL 25 MG PO CAPS
25.0000 mg | ORAL_CAPSULE | ORAL | Status: AC
  Administered 2020-07-26: 25 mg via ORAL

## 2020-07-26 MED ORDER — CIPROFLOXACIN HCL 500 MG PO TABS
500.0000 mg | ORAL_TABLET | ORAL | Status: AC
  Administered 2020-07-26: 500 mg via ORAL

## 2020-07-26 MED ORDER — DIPHENHYDRAMINE HCL 25 MG PO CAPS
ORAL_CAPSULE | ORAL | Status: AC
  Filled 2020-07-26: qty 1

## 2020-07-26 MED ORDER — DIAZEPAM 5 MG PO TABS
10.0000 mg | ORAL_TABLET | ORAL | Status: AC
  Administered 2020-07-26: 10 mg via ORAL

## 2020-07-26 MED ORDER — SODIUM CHLORIDE 0.9 % IV SOLN: INTRAVENOUS | Status: DC

## 2020-07-26 MED ORDER — CIPROFLOXACIN HCL 500 MG PO TABS
ORAL_TABLET | ORAL | Status: AC
  Filled 2020-07-26: qty 1

## 2020-07-26 MED ORDER — DIAZEPAM 5 MG PO TABS
ORAL_TABLET | ORAL | Status: AC
  Filled 2020-07-26: qty 2

## 2020-07-26 NOTE — Discharge Instructions (Addendum)
Lithotripsy, Care After This sheet gives you information about how to care for yourself after your procedure. Your health care provider may also give you more specific instructions. If you have problems or questions, contact your health care provider. What can I expect after the procedure? After the procedure, it is common to have:  Some blood in your urine. This should only last for a few days.  Soreness in your back, sides, or upper abdomen for a few days.  Blotches or bruises on your back where the pressure wave entered the skin.  Pain, discomfort, or nausea when pieces (fragments) of the kidney stone move through the tube that carries urine from the kidney to the bladder (ureter). Stone fragments may pass soon after the procedure, but they may continue to pass for up to 4-8 weeks. ? If you have severe pain or nausea, contact your health care provider. This may be caused by a large stone that was not broken up, and this may mean that you need more treatment.  Some pain or discomfort during urination.  Some pain or discomfort in the lower abdomen or (in men) at the base of the penis. Follow these instructions at home: Medicines  Take over-the-counter and prescription medicines only as told by your health care provider.  If you were prescribed an antibiotic medicine, take it as told by your health care provider. Do not stop taking the antibiotic even if you start to feel better.  Do not drive for 24 hours if you were given a medicine to help you relax (sedative).  Do not drive or use heavy machinery while taking prescription pain medicine. Eating and drinking      Drink enough water and fluids to keep your urine clear or pale yellow. This helps any remaining pieces of the stone to pass. It can also help prevent new stones from forming.  Eat plenty of fresh fruits and vegetables.  Follow instructions from your health care provider about eating and drinking restrictions. You may be  instructed: ? To reduce how much salt (sodium) you eat or drink. Check ingredients and nutrition facts on packaged foods and beverages. ? To reduce how much meat you eat.  Eat the recommended amount of calcium for your age and gender. Ask your health care provider how much calcium you should have. General instructions  Get plenty of rest.  Most people can resume normal activities 1-2 days after the procedure. Ask your health care provider what activities are safe for you.  Your health care provider may direct you to lie in a certain position (postural drainage) and tap firmly (percuss) over your kidney area to help stone fragments pass. Follow instructions as told by your health care provider.  If directed, strain all urine through the strainer that was provided by your health care provider. ? Keep all fragments for your health care provider to see. Any stones that are found may be sent to a medical lab for examination. The stone may be as small as a grain of salt.  Keep all follow-up visits as told by your health care provider. This is important. Contact a health care provider if:  You have pain that is severe or does not get better with medicine.  You have nausea that is severe or does not go away.  You have blood in your urine longer than your health care provider told you to expect.  You have more blood in your urine.  You have pain during urination that does   not go away.  You urinate more frequently than usual and this does not go away.  You develop a rash or any other possible signs of an allergic reaction. Get help right away if:  You have severe pain in your back, sides, or upper abdomen.  You have severe pain while urinating.  Your urine is very dark red.  You have blood in your stool (feces).  You cannot pass any urine at all.  You feel a strong urge to urinate after emptying your bladder.  You have a fever or chills.  You develop shortness of breath,  difficulty breathing, or chest pain.  You have severe nausea that leads to persistent vomiting.  You faint. Summary  After this procedure, it is common to have some pain, discomfort, or nausea when pieces (fragments) of the kidney stone move through the tube that carries urine from the kidney to the bladder (ureter). If this pain or nausea is severe, however, you should contact your health care provider.  Most people can resume normal activities 1-2 days after the procedure. Ask your health care provider what activities are safe for you.  Drink enough water and fluids to keep your urine clear or pale yellow. This helps any remaining pieces of the stone to pass, and it can help prevent new stones from forming.  If directed, strain your urine and keep all fragments for your health care provider to see. Fragments or stones may be as small as a grain of salt.  Get help right away if you have severe pain in your back, sides, or upper abdomen or have severe pain while urinating. This information is not intended to replace advice given to you by your health care provider. Make sure you discuss any questions you have with your health care provider. Document Revised: 03/10/2019 Document Reviewed: 10/18/2016 Elsevier Patient Education  2020 [employer]  Post Anesthesia Home Care Instructions  Activity: Get plenty of rest for the remainder of the day. A responsible individual must stay with you for 24 hours following the procedure.  For the next 24 hours, DO NOT: -Drive a car -Operate machinery -Drink alcoholic beverages -Take any medication unless instructed by your physician -Make any legal decisions or sign important papers.  Meals: Start with liquid foods such as gelatin or soup. Progress to regular foods as tolerated. Avoid greasy, spicy, heavy foods. If nausea and/or vomiting occur, drink only clear liquids until the nausea and/or vomiting subsides. Call your physician if vomiting  continues.      

## 2020-07-26 NOTE — Interval H&P Note (Signed)
History and Physical Interval Note: No change in stone.  07/26/2020 11:31 AM  Shelby Parker  has presented today for surgery, with the diagnosis of LEFT URETERAL CALCULUS.  The various methods of treatment have been discussed with the patient and family. After consideration of risks, benefits and other options for treatment, the patient has consented to  Procedure(s): EXTRACORPOREAL SHOCK WAVE LITHOTRIPSY (ESWL) (Left) as a surgical intervention.  The patient's history has been reviewed, patient examined, no change in status, stable for surgery.  I have reviewed the patient's chart and labs.  Questions were answered to the patient's satisfaction.     Bjorn Pippin

## 2020-07-27 ENCOUNTER — Encounter (HOSPITAL_BASED_OUTPATIENT_CLINIC_OR_DEPARTMENT_OTHER): Payer: Self-pay | Admitting: Urology

## 2020-07-27 ENCOUNTER — Telehealth: Payer: Self-pay

## 2020-07-27 NOTE — Telephone Encounter (Signed)
I called the patient's husband.  She has not taken any hydrocodone until now.  She is also taken nausea medicine.  She was given instructions that she could also take ibuprofen.  They will give [place] an update tomorrow.  She is feeling a bit better and passing fragments.

## 2020-07-27 NOTE — Telephone Encounter (Signed)
Pt had lithotripsy completed yesterday at [facility] by Dr. Annabell Howells  Husband reports pt pain has not subsided. Unable to eat due to nausea and pain.  Reports pt is voiding and denies any fever.  Currently taking oxycodone and zofran rx for symptoms.   Message sent to MD for further instructions for patient.

## 2020-08-04 ENCOUNTER — Ambulatory Visit: Payer: Self-pay | Admitting: Urology

## 2020-08-13 ENCOUNTER — Other Ambulatory Visit: Payer: Self-pay

## 2020-08-13 ENCOUNTER — Ambulatory Visit (INDEPENDENT_AMBULATORY_CARE_PROVIDER_SITE_OTHER): Payer: BC Managed Care – PPO | Admitting: Urology

## 2020-08-13 ENCOUNTER — Encounter: Payer: Self-pay | Admitting: Urology

## 2020-08-13 ENCOUNTER — Ambulatory Visit (HOSPITAL_COMMUNITY)
Admission: RE | Admit: 2020-08-13 | Discharge: 2020-08-13 | Disposition: A | Discharge status: Home / Self Care | Payer: BC Managed Care – PPO | Source: Ambulatory Visit | Attending: Urology | Admitting: Urology

## 2020-08-13 VITALS — BP 100/65 | HR 73 | Temp 98.2°F | Ht 62.0 in | Wt 142.0 lb

## 2020-08-13 DIAGNOSIS — N200 Calculus of kidney: Secondary | ICD-10-CM | POA: Diagnosis not present

## 2020-08-13 DIAGNOSIS — I878 Other specified disorders of veins: Secondary | ICD-10-CM | POA: Diagnosis not present

## 2020-08-13 DIAGNOSIS — N201 Calculus of ureter: Secondary | ICD-10-CM | POA: Diagnosis not present

## 2020-08-13 LAB — URINALYSIS, ROUTINE W REFLEX MICROSCOPIC
Bilirubin, UA: NEGATIVE
Glucose, UA: NEGATIVE
Ketones, UA: NEGATIVE
Leukocytes,UA: NEGATIVE
Nitrite, UA: NEGATIVE
Protein,UA: NEGATIVE
Specific Gravity, UA: >1.03 — ABNORMAL HIGH (ref 1.005–1.030)
Urobilinogen, Ur: 0.2 mg/dL (ref 0.2–1.0)
pH, UA: 5.5 (ref 5.0–7.5)

## 2020-08-13 LAB — MICROSCOPIC EXAMINATION: Renal Epithel, UA: NONE SEEN /hpf

## 2020-08-13 NOTE — Progress Notes (Signed)
Urological Symptom Review  Patient is experiencing the following symptoms: Kidney stone  Review of Systems  Gastrointestinal (upper)  : Negative for upper GI symptoms  Gastrointestinal (lower) : Negative for lower GI symptoms  Constitutional : Negative for symptoms  Skin: Negative for skin symptoms  Eyes: Negative for eye symptoms  Ear/Nose/Throat : Negative for Ear/Nose/Throat symptoms  Hematologic/Lymphatic: Negative for Hematologic/Lymphatic symptoms  Cardiovascular : Negative for cardiovascular symptoms  Respiratory : Negative for respiratory symptoms  Endocrine: Negative for endocrine symptoms  Musculoskeletal: Negative for musculoskeletal symptoms  Neurological: Negative for neurological symptoms  Psychologic: Negative for psychiatric symptoms  

## 2020-08-13 NOTE — Patient Instructions (Signed)
Dietary Guidelines to Help Prevent Kidney Stones Kidney stones are deposits of minerals and salts that form inside your kidneys. Your risk of developing kidney stones may be greater depending on your diet, your lifestyle, the medicines you take, and whether you have certain medical conditions. Most people can reduce their chances of developing kidney stones by following the instructions below. Depending on your overall health and the type of kidney stones you tend to develop, your dietitian may give you more specific instructions. What are tips for following this plan? Reading food labels  Choose foods with "no salt added" or "low-salt" labels. Limit your sodium intake to less than 1500 mg per day.  Choose foods with calcium for each meal and snack. Try to eat about 300 mg of calcium at each meal. Foods that contain 200-500 mg of calcium per serving include: ? 8 oz (237 ml) of milk, fortified nondairy milk, and fortified fruit juice. ? 8 oz (237 ml) of kefir, yogurt, and soy yogurt. ? 4 oz (118 ml) of tofu. ? 1 oz of cheese. ? 1 cup (300 g) of dried figs. ? 1 cup (91 g) of cooked broccoli. ? 1-3 oz can of sardines or mackerel.  Most people need 1000 to 1500 mg of calcium each day. Talk to your dietitian about how much calcium is recommended for you. Shopping  Buy plenty of fresh fruits and vegetables. Most people do not need to avoid fruits and vegetables, even if they contain nutrients that may contribute to kidney stones.  When shopping for convenience foods, choose: ? Whole pieces of fruit. ? Premade salads with dressing on the side. ? Low-fat fruit and yogurt smoothies.  Avoid buying frozen meals or prepared deli foods.  Look for foods with live cultures, such as yogurt and kefir. Cooking  Do not add salt to food when cooking. Place a salt shaker on the table and allow each person to add his or her own salt to taste.  Use vegetable protein, such as beans, textured vegetable  protein (TVP), or tofu instead of meat in pasta, casseroles, and soups. Meal planning   Eat less salt, if told by your dietitian. To do this: ? Avoid eating processed or premade food. ? Avoid eating fast food.  Eat less animal protein, including cheese, meat, poultry, or fish, if told by your dietitian. To do this: ? Limit the number of times you have meat, poultry, fish, or cheese each week. Eat a diet free of meat at least 2 days a week. ? Eat only one serving each day of meat, poultry, fish, or seafood. ? When you prepare animal protein, cut pieces into small portion sizes. For most meat and fish, one serving is about the size of one deck of cards.  Eat at least 5 servings of fresh fruits and vegetables each day. To do this: ? Keep fruits and vegetables on hand for snacks. ? Eat 1 piece of fruit or a handful of berries with breakfast. ? Have a salad and fruit at lunch. ? Have two kinds of vegetables at dinner.  Limit foods that are high in a substance called oxalate. These include: ? Spinach. ? Rhubarb. ? Beets. ? Potato chips and french fries. ? Nuts.  If you regularly take a diuretic medicine, make sure to eat at least 1-2 fruits or vegetables high in potassium each day. These include: ? Avocado. ? Banana. ? Orange, prune, carrot, or tomato juice. ? Baked potato. ? Cabbage. ? Beans and split   peas. General instructions   Drink enough fluid to keep your urine clear or pale yellow. This is the most important thing you can do.  Talk to your health care provider and dietitian about taking daily supplements. Depending on your health and the cause of your kidney stones, you may be advised: ? Not to take supplements with vitamin C. ? To take a calcium supplement. ? To take a daily probiotic supplement. ? To take other supplements such as magnesium, fish oil, or vitamin B6.  Take all medicines and supplements as told by your health care provider.  Limit alcohol intake to no  more than 1 drink a day for nonpregnant women and 2 drinks a day for men. One drink equals 12 oz of beer, 5 oz of wine, or 1 oz of hard liquor.  Lose weight if told by your health care provider. Work with your dietitian to find strategies and an eating plan that works best for you. What foods are not recommended? Limit your intake of the following foods, or as told by your dietitian. Talk to your dietitian about specific foods you should avoid based on the type of kidney stones and your overall health. Grains Breads. Bagels. Rolls. Baked goods. Salted crackers. Cereal. Pasta. Vegetables Spinach. Rhubarb. Beets. Canned vegetables. Pickles. Olives. Meats and other protein foods Nuts. Nut butters. Large portions of meat, poultry, or fish. Salted or cured meats. Deli meats. Hot dogs. Sausages. Dairy Cheese. Beverages Regular soft drinks. Regular vegetable juice. Seasonings and other foods Seasoning blends with salt. Salad dressings. Canned soups. Soy sauce. Ketchup. Barbecue sauce. Canned pasta sauce. Casseroles. Pizza. Lasagna. Frozen meals. Potato chips. French fries. Summary  You can reduce your risk of kidney stones by making changes to your diet.  The most important thing you can do is drink enough fluid. You should drink enough fluid to keep your urine clear or pale yellow.  Ask your health care provider or dietitian how much protein from animal sources you should eat each day, and also how much salt and calcium you should have each day. This information is not intended to replace advice given to you by your health care provider. Make sure you discuss any questions you have with your health care provider. Document Revised: 03/19/2019 Document Reviewed: 11/07/2016 Elsevier Patient Education  2020 Elsevier Inc.  

## 2020-08-13 NOTE — Progress Notes (Signed)
08/13/2020 2:58 PM   Shelby Parker 1981-11-14 [id number]  Referring provider: Benita Stabile, MD [address] [name],  [place] [address]  nephrolithiasis  HPI: Shelby Parker is a 38yo here for followup for nephrolithiasis. She underwent ESWL 2 weeks ago. She passed numerous pieces and brought them with her. She then passed a right calculus 1 week ago. KUB from today shows no left ureteral calculi. No flank pain. NO hematuria or dysuria   PMH: Past Medical History:  Diagnosis Date  . Nephrolithiasis   . Renal disorder    kidney stones    Surgical History: Past Surgical History:  Procedure Laterality Date  . ABDOMINAL HYSTERECTOMY    . EXTRACORPOREAL SHOCK WAVE LITHOTRIPSY Left 07/26/2020   Procedure: EXTRACORPOREAL SHOCK WAVE LITHOTRIPSY (ESWL);  Surgeon: Bjorn Pippin, MD;  Location: [facility];  Service: Urology;  Laterality: Left;    Home Medications:  Allergies as of 08/13/2020      Reactions   Codeine    Hydrocodone       Medication List       Accurate as of August 13, 2020  2:58 PM. If you have any questions, ask your nurse or doctor.        buPROPion 100 MG 12 hr tablet Commonly known as: WELLBUTRIN SR Take 100 mg by mouth every morning.   ketorolac 10 MG tablet Commonly known as: TORADOL Take 1 tablet (10 mg total) by mouth every 8 (eight) hours as needed.   ondansetron 4 MG tablet Commonly known as: ZOFRAN Take 1 tablet (4 mg total) by mouth every 8 (eight) hours as needed for nausea or vomiting.   oxyCODONE-acetaminophen 5-325 MG tablet Commonly known as: Percocet Take 1 tablet by mouth every 4 (four) hours as needed for moderate pain or severe pain.   tamsulosin 0.4 MG Caps capsule Commonly known as: Flomax Take 1 capsule (0.4 mg total) by mouth daily.   traMADol 50 MG tablet Commonly known as: ULTRAM Take 50 mg by mouth 2 (two) times daily as needed for severe pain.       Allergies:  Allergies  Allergen Reactions  .  Codeine   . Hydrocodone     Family History: No family history on file.  Social History:  reports that she has never smoked. She has never used smokeless tobacco. She reports that she does not drink alcohol and does not use drugs.  ROS: All other review of systems were reviewed and are negative except what is noted above in HPI  Physical Exam: BP 100/65   Pulse 73   Temp 98.2 F (36.8 C)   Ht 5\' 2"  (1.575 m)   Wt 142 lb (64.4 kg)   LMP 07/22/2010   BMI 25.97 kg/m   Constitutional:  Alert and oriented, No acute distress. HEENT: [place] AT, moist mucus membranes.  Trachea midline, no masses. Cardiovascular: No clubbing, cyanosis, or edema. Respiratory: Normal respiratory effort, no increased work of breathing. GI: Abdomen is soft, nontender, nondistended, no abdominal masses GU: No CVA tenderness.  Lymph: No cervical or inguinal lymphadenopathy. Skin: No rashes, bruises or suspicious lesions. Neurologic: Grossly intact, no focal deficits, moving all 4 extremities. Psychiatric: Normal mood and affect.  Laboratory Data: Lab Results  Component Value Date   WBC 11.5 (H) 10/12/2010   HGB 8.7 (L) 10/12/2010   HCT 25.1 (L) 10/12/2010   MCV 88.0 10/12/2010   PLT 190 10/12/2010    No results found for: CREATININE  No  results found for: PSA  No results found for: TESTOSTERONE  No results found for: HGBA1C  Urinalysis    Component Value Date/Time   COLORURINE YELLOW 07/21/2020 1312   APPEARANCEUR Clear 07/22/2020 0954   LABSPEC 1.014 07/21/2020 1312   PHURINE 6.0 07/21/2020 1312   GLUCOSEU Negative 07/22/2020 0954   HGBUR MODERATE (A) 07/21/2020 1312   BILIRUBINUR Negative 07/22/2020 0954   KETONESUR NEGATIVE 07/21/2020 1312   PROTEINUR Negative 07/22/2020 0954   PROTEINUR NEGATIVE 07/21/2020 1312   NITRITE Negative 07/22/2020 0954   NITRITE NEGATIVE 07/21/2020 1312   LEUKOCYTESUR Negative 07/22/2020 0954   LEUKOCYTESUR NEGATIVE 07/21/2020 1312    Lab Results    Component Value Date   LABMICR See below: 07/22/2020   WBCUA 0-5 07/22/2020   LABEPIT >10 (A) 07/22/2020   BACTERIA Many (A) 07/22/2020    Pertinent Imaging: KUB today: Images reviewed and discussed with the patient Results for orders placed during the hospital encounter of 07/26/20  DG Abd 1 View  Narrative CLINICAL DATA:  Left ureteral calculus.  EXAM: ABDOMEN - 1 VIEW  COMPARISON:  07/22/2020  FINDINGS: A 5-6 mm distal left ureteral calculus is likely unchanged, slightly less well demonstrated on today's study due to overlying bowel. An unchanged small adjacent calcification in the left hemipelvis corresponds to a phlebolith. A 3 mm stone projecting over the lower pole of the right kidney is unchanged. No dilated loops of bowel are seen to suggest obstruction. No acute osseous abnormality is identified.  IMPRESSION: Unchanged small distal left ureteral and right renal calculi.   Electronically Signed By: Sebastian Ache M.D. On: 07/26/2020 10:27  No results found for this or any previous visit.  No results found for this or any previous visit.  No results found for this or any previous visit.  No results found for this or any previous visit.  No results found for this or any previous visit.  No results found for this or any previous visit.  Results for orders placed during the hospital encounter of 07/21/20  CT Renal Stone Study  Narrative CLINICAL DATA:  Left flank pain.  Kidney stones suspected.  EXAM: CT ABDOMEN AND PELVIS WITHOUT CONTRAST  TECHNIQUE: Multidetector CT imaging of the abdomen and pelvis was performed following the standard protocol without IV contrast.  COMPARISON:  None.  FINDINGS: Lower chest: Unremarkable  Hepatobiliary: No focal abnormality in the liver on this study without intravenous contrast. Possible tiny stone or gallbladder polyp (axial 40/2 and coronal 36/5). No intrahepatic or extrahepatic biliary  dilation.  Pancreas: No focal mass lesion. No dilatation of the main duct. No intraparenchymal cyst. No peripancreatic edema.  Spleen: No splenomegaly. No focal mass lesion.  Adrenals/Urinary Tract: No adrenal nodule or mass. 3 x 4 mm nonobstructing stone identified lower pole right kidney. No right ureteral stone. No secondary changes in the right kidney or ureter. No stones are seen in the left kidney although there is mild to moderate left hydroureteronephrosis. Left ureter is dilated down to the pelvis where a 2 x 4 x 8 mm distal left ureteral stone is identified (well demonstrated coronal 57/5 and visible on axial 74/2). Bladder is nondistended.  Stomach/Bowel: Stomach is unremarkable. No gastric wall thickening. No evidence of outlet obstruction. Duodenum is normally positioned as is the ligament of Treitz. No small bowel wall thickening. No small bowel dilatation. No gross colonic mass. No colonic wall thickening.  Vascular/Lymphatic: No abdominal aortic aneurysm. No abdominal aortic atherosclerotic calcification. There is no gastrohepatic  or hepatoduodenal ligament lymphadenopathy. No retroperitoneal or mesenteric lymphadenopathy. No pelvic sidewall lymphadenopathy.  Reproductive: Unremarkable.  Other: No intraperitoneal free fluid.  Musculoskeletal: No worrisome lytic or sclerotic osseous abnormality.  IMPRESSION: 1. 2 x 4 x 8 mm distal left ureteral stone with mild to moderate left hydroureteronephrosis. 2. 3 x 4 mm nonobstructing stone lower pole right kidney. 3. Possible tiny stone or gallbladder polyp.   Electronically Signed By: Kennith Center M.D. On: 07/21/2020 11:44   Assessment & Plan:    1. Nephrolithiasis -RTC 3 months with KUB - Urinalysis, Routine w reflex microscopic - Abdomen 1 view (KUB)   No follow-ups on file.  Wilkie Aye, MD  [facility] [place]

## 2020-08-13 NOTE — Addendum Note (Signed)
Addended byGustavus Messing on: 08/13/2020 05:25 PM   Modules accepted: Orders

## 2020-08-18 ENCOUNTER — Ambulatory Visit: Payer: Self-pay | Admitting: Urology

## 2020-08-20 LAB — CALCULI, WITH PHOTOGRAPH (CLINICAL LAB)
Calcium Oxalate Dihydrate: 40 %
Calcium Oxalate Monohydrate: 60 %
Weight Calculi: 72 mg

## 2020-08-31 ENCOUNTER — Encounter: Attending: Internal Medicine | Primary: Internal Medicine

## 2020-09-01 ENCOUNTER — Ambulatory Visit: Admit: 2020-09-01 | Discharge: 2020-09-01 | Attending: Internal Medicine | Primary: Internal Medicine

## 2020-09-01 ENCOUNTER — Ambulatory Visit: Attending: Internal Medicine | Primary: Internal Medicine

## 2020-09-01 DIAGNOSIS — Z7689 Persons encountering health services in other specified circumstances: Secondary | ICD-10-CM

## 2020-09-01 MED ORDER — CLONAZEPAM 2 MG TAB
2 mg | ORAL_TABLET | Freq: Every evening | ORAL | 0 refills | Status: DC
Start: 2020-09-01 — End: 2020-09-30

## 2020-09-01 MED ORDER — SERTRALINE 100 MG TAB
100 mg | ORAL_TABLET | Freq: Every day | ORAL | 0 refills | Status: DC
Start: 2020-09-01 — End: 2020-09-30

## 2020-09-01 MED ORDER — OMEPRAZOLE 20 MG CAP, DELAYED RELEASE
20 mg | ORAL_CAPSULE | Freq: Every day | ORAL | 0 refills | Status: DC
Start: 2020-09-01 — End: 2020-09-30

## 2020-09-01 MED ORDER — LOSARTAN 25 MG TAB
25 mg | ORAL_TABLET | Freq: Every day | ORAL | 0 refills | Status: DC
Start: 2020-09-01 — End: 2020-09-30

## 2020-09-01 NOTE — ACP (Advance Care Planning) (Signed)
No living will or poa

## 2020-09-01 NOTE — Progress Notes (Signed)
Progress Notes by Aara Jacquot, [place] J, DO at 09/01/20 1300                Author: Juno Bozard, [place] J, DO  Service: --  Author Type: Physician       Filed: 09/01/20 1458  Encounter Date: 09/01/2020  Status: Signed          Editor: Emmanuelle Hibbitts, [place] J, DO (Physician)                    [place] Ronith Berti, D.O.    [facility]   [address]   [place], [place] [address]   Tel: [phone]        History and Physical Office Visit           Patient Name:  Karen Logan      DOB:   08-26-1981        MRN:    [id number]         Today's Date: 09/01/20 12:36 PM        Subjective        Patient is a 39 year old female with a pmh as listed below. She presents to the office today to establish care and for follow up on  hypertension and depression. She is currently taking Lisinopril for BP and Clonazepam and Sertraline for depression/anxiety. She states she gained 94 pounds in the last year. She has been treated for depression since 2009/2010. She states the Zoloft/Klonopin  combination is working really well for her. She states she does not have any thoughts of harming herself or others and had suicidal ideations over 4 years ago but has not had any since. She states she had one suicide attempt 4 years, she used a knife  to slit her wrists. She does not and has not ever seen a psychiatrist, but is willing to see one. She states she has nausea/vomiting almost every night, she states it is unrelated to eating.       She states her chest pain is only at night when she is laying in bed after she takes her blood pressure medication. She did see a cardiologist at her previous home town in [place] and he did not find any abnormalities. She relates swelling of the  lower extremities when this happens as well. She also describes a "weird sensation" pain that is 4/10 in intensity when she sits for too long. She states she also experiences a sour taste in her mouth in the morning.       She states she exercises everyday  doing Yoga and working out. She states her diet is well-balanced.       She just finished her period 2 days ago.      Her last Pap smear was in April and was normal.             Cancer Screening Hx          Test  Most Recent (date)  Result  Due     Colonoscopy  N/A  N/A  N/A     Low-dose CT scan (smoking hx)  N/A  N/A  N/A     Abdominal Duplex (AAA)  N/A  N/A  N/A     Mammogram (women > 50)  N/A  N/A  N/A     DEXA Scan  N/A  N/A  N/A     Papsmear  April 2021    2024  PSA (if desired)  N/A  N/A  N/A        Review of Systems    Constitutional: Positive for malaise/fatigue. Negative for chills, fever and weight loss.    HENT: Positive for tinnitus. Negative for hearing loss.     Eyes: Positive for blurred vision. Negative for double vision, photophobia, pain and discharge.    Respiratory: Negative for cough, sputum production and shortness of breath.     Cardiovascular: Positive for chest pain and leg swelling .    Gastrointestinal: Positive for nausea and vomiting . Negative for abdominal pain, blood in stool, constipation, diarrhea and melena.    Genitourinary: Positive for frequency. Negative for dysuria, flank pain and hematuria.    Musculoskeletal: Positive for myalgias.    Neurological: Positive for weakness. Negative for dizziness, tingling, tremors, sensory change and headaches.    Psychiatric/Behavioral: Positive for depression. Negative for memory loss, substance abuse and suicidal ideas. The patient  is nervous/anxious.           Past Medical History:      Diagnosis  Date      ?  Allergies        ?  Anxiety        ?  Bladder problem        ?  Carpal tunnel syndrome        ?  Depression        ?  Encounter to establish care  09/01/2020      ?  Hypertension              Social History            Socioeconomic History      ?  Marital status:  MARRIED          Spouse name:  Not on file      ?  Number of children:  Not on file      ?  Years of education:  Not on file      ?  Highest education level:  Not  on file      Occupational History      ?  Not on file      Tobacco Use      ?  Smoking status:  Never Smoker      ?  Smokeless tobacco:  Never Used      Vaping Use      ?  Vaping Use:  Never used      Substance and Sexual Activity      ?  Alcohol use:  Never      ?  Drug use:  Never      ?  Sexual activity:  Yes      Other Topics  Concern      ?  Not on file      Social History Narrative      ?  Not on file            Social Determinants of Health            Financial Resource Strain:       ?  Difficulty of Paying Living Expenses:       Food Insecurity:       ?  Worried About Programme researcher, broadcasting/film/videounning Out of Food in the Last Year:       ?  Baristaan Out of Food in the Last Year:       Transportation Needs:       ?  Lack of Transportation (Medical):       ?  Lack of Transportation (Non-Medical):       Physical Activity:       ?  Days of Exercise per Week:       ?  Minutes of Exercise per Session:       Stress:       ?  Feeling of Stress :       Social Connections:       ?  Frequency of Communication with Friends and Family:       ?  Frequency of Social Gatherings with Friends and Family:       ?  Attends Religious Services:       ?  Active Member of Clubs or Organizations:       ?  Attends Banker Meetings:       ?  Marital Status:       Intimate Partner Violence:       ?  Fear of Current or Ex-Partner:       ?  Emotionally Abused:       ?  Physically Abused:       ?  Sexually Abused:                     Current Outpatient Medications        Medication  Sig         ?  lisinopriL (PRINIVIL, ZESTRIL) 30 mg tablet  Take 30 mg by mouth daily.     ?  sertraline (ZOLOFT) 100 mg tablet  Take 100 mg by mouth daily.         ?  clonazePAM (KlonoPIN) 2 mg tablet  Take 1.5 mg by mouth nightly.          No current facility-administered medications for this visit.              Objective          Vitals:          09/01/20 1326        BP:  122/78     BP 1 Location:  Right upper arm     BP Patient Position:  Sitting     BP Cuff Size:  Large  adult     Pulse:  74     Temp:  99 ??F (37.2 ??C)     TempSrc:  Temporal     Height:  5\' 4"  (1.626 m)     Weight:  244 lb 6.4 oz (110.9 kg)        SpO2:  97%            Physical Exam   Vitals and nursing note reviewed.   Constitutional:        General: She is not in acute distress.     Appearance: Normal appearance. She is obese. She is not ill-appearing, toxic-appearing  or diaphoretic.   HENT :       Head: Normocephalic and atraumatic.      Right Ear: Tympanic membrane, ear canal and external ear normal. There is no impacted cerumen.      Left Ear: Tympanic membrane, ear canal and external ear normal. There is no impacted cerumen.       Nose: Nose normal.      Mouth/Throat:      Mouth: Mucous membranes are moist.      Pharynx: Oropharynx is clear.    Eyes:  Conjunctiva/sclera: Conjunctivae normal.      Pupils: Pupils are equal, round, and reactive to light.   Cardiovascular:       Rate and Rhythm: Normal rate and regular rhythm.      Pulses: Normal pulses.      Heart sounds: Normal heart sounds. No murmur heard.   No friction rub. No gallop.    Pulmonary:       Effort: Pulmonary effort is normal. No respiratory distress.      Breath sounds: Normal breath sounds. No wheezing.    Abdominal:      General: Abdomen is flat. Bowel sounds are normal. There is no distension.      Palpations: Abdomen is soft.      Tenderness: There is no abdominal tenderness.     Musculoskeletal:      Cervical back: Normal range of motion and neck supple. No tenderness.    Skin:      General: Skin is warm and dry.   Neurological :       General: No focal deficit present.      Mental Status: She is alert and oriented to person, place, and time. Mental status is at baseline.   Psychiatric:         Mood and Affect: Mood normal.         Behavior: Behavior normal.         Thought Content:  Thought content normal.         Judgment: Judgment normal.                Recommendations        Assessment:     Patient Active Problem List         Diagnosis  Code         ?  Encounter to establish care  Z76.89     ?  Depression  F32.9         ?  Hypertension  I10         Plan:   -Influenza vaccine   -Tdap vaccine    -Patient would like to see ENT for tinnitus   -check CBC   -check Hep C   -check BMP   -check TSH   -check HbA1c   -Check U/A   -Check Doppler U/S of bilateral lower extremities   -Omeprazole 20 mg daily    -Discontinue Lisinopril due to patient reported side effects   -Start Losartan 25   -Psychiatry referral    -Continue Zoloft and Klonopin    -instructed patient to take all of her medications in the AM except for the Klonopin, which she is to take before bed each night      Discussed with the patient the importance of maintaining a healthy weight and exercising regularly. A summary of the discussion is listed below.    You can reduce your weight if you burn up more calories than you eat--every day!   To burn up more calories, increase your physical activity.     ??  A good place to start is to walk, at a good fast pace, for 60 minutes every day of the week.      ??  If your knees or hips bother you, find another activity, such as biking or water aerobics.    ??  It's fine to break the 60 minutes into shorter sessions and spread them out over the day (such as 30 minutes in the morning and 30 minutes in  the evening)   ??  You need to burn 3500 calories more than you eat to lose a pound   To eat fewer calories in a day:   ??  Choose low calorie foods, and remember that sweets and fatty foods have a lot of calories   ??  Reduce your serving size--we often eat far more than we need to feel full   ??  Look for easy ways to trim calories--such as sweetened drinks.  Remember that drinking one 160 calorie [employer] Pepsi soda daily, for example,  would add 16 lbs to your weight each year.      Health Maintenance:   -Pap Smear due in 2024      Time Spent in Patient Room: 15 minutes   Time Spent Pre- and Post Reviewing patient's chart: 40 minutes   Total Time:  60 minutes      Signed: [place] Lajune Perine, D.O.   09/01/20   12:36 PM

## 2020-09-02 ENCOUNTER — Telehealth

## 2020-09-02 LAB — BASIC METABOLIC PANEL
BUN: 6 mg/dL (ref 6–20)
Bun/Cre Ratio: 10 NA (ref 9–23)
CO2: 23 mmol/L (ref 20–29)
Calcium: 9.8 mg/dL (ref 8.7–10.2)
Chloride: 104 mmol/L (ref 96–106)
Creatinine: 0.63 mg/dL (ref 0.57–1.00)
EGFR IF NonAfrican American: 114 mL/min/{1.73_m2} (ref >59)
GFR African American: 132 mL/min/{1.73_m2} (ref >59)
Glucose: 95 mg/dL (ref 65–99)
Potassium: 4.3 mmol/L (ref 3.5–5.2)
Sodium: 141 mmol/L (ref 134–144)

## 2020-09-02 LAB — URINALYSIS W/O MICRO
Bilirubin, Urine: NEGATIVE
Bilirubin: NEGATIVE
Blood, Urine: NEGATIVE
Blood: NEGATIVE
Glucose, UA: NEGATIVE
Glucose: NEGATIVE
Ketone: NEGATIVE
Ketones, Urine: NEGATIVE
Leukocyte Esterase, Urine: NEGATIVE
Leukocyte Esterase: NEGATIVE
Nitrite, Urine: NEGATIVE
Nitrites: NEGATIVE
Protein, UA: NEGATIVE
Protein: NEGATIVE
Specific Gravity, UA: 1.008 NA (ref 1.005–1.030)
Specific Gravity: 1.008 (ref 1.005–1.030)
Urobilinogen, Urine: 0.2 mg/dL (ref 0.2–1.0)
Urobilinogen: 0.2 mg/dL (ref 0.2–1.0)
pH (UA): 6 (ref 5.0–7.5)
pH, UA: 6 NA (ref 5.0–7.5)

## 2020-09-02 LAB — CBC WITH AUTO DIFFERENTIAL
Basophils %: 1 %
Basophils Absolute: 0.1 10*3/uL (ref 0.0–0.2)
Eosinophils %: 2 %
Eosinophils Absolute: 0.2 10*3/uL (ref 0.0–0.4)
Granulocyte Absolute Count: 0 10*3/uL (ref 0.0–0.1)
Hematocrit: 42.2 % (ref 34.0–46.6)
Hemoglobin: 13.2 g/dL (ref 11.1–15.9)
Immature Granulocytes: 0 %
Lymphocytes %: 29 %
Lymphocytes Absolute: 3.3 10*3/uL — ABNORMAL HIGH (ref 0.7–3.1)
MCH: 26.9 pg (ref 26.6–33.0)
MCHC: 31.3 g/dL — ABNORMAL LOW (ref 31.5–35.7)
MCV: 86 fL (ref 79–97)
Monocytes %: 5 %
Monocytes Absolute: 0.6 10*3/uL (ref 0.1–0.9)
Neutrophils %: 63 %
Neutrophils Absolute: 7.1 10*3/uL — ABNORMAL HIGH (ref 1.4–7.0)
Platelets: 398 10*3/uL (ref 150–450)
RBC: 4.9 x10E6/uL (ref 3.77–5.28)
RDW: 13.2 % (ref 11.7–15.4)
WBC: 11.3 10*3/uL — ABNORMAL HIGH (ref 3.4–10.8)

## 2020-09-02 LAB — HEMOGLOBIN A1C W/EAG
Hemoglobin A1C: 5.5 % (ref 4.8–5.6)
eAG: 111 mg/dL

## 2020-09-02 LAB — TSH 3RD GENERATION
TSH: 0.818 u[IU]/mL (ref 0.450–4.500)
TSH: 0.818 u[IU]/mL (ref 0.450–4.500)

## 2020-09-02 LAB — HEPATITIS C ANTIBODY: HCV Ab: <0.1 s/co ratio (ref 0.0–0.9)

## 2020-09-02 LAB — CBC WITH AUTOMATED DIFF
ABS. BASOPHILS: 0.1 10*3/uL (ref 0.0–0.2)
ABS. EOSINOPHILS: 0.2 10*3/uL (ref 0.0–0.4)
ABS. IMM. GRANS.: 0 10*3/uL (ref 0.0–0.1)
ABS. MONOCYTES: 0.6 10*3/uL (ref 0.1–0.9)
ABS. NEUTROPHILS: 7.1 10*3/uL — ABNORMAL HIGH (ref 1.4–7.0)
Abs Lymphocytes: 3.3 10*3/uL — ABNORMAL HIGH (ref 0.7–3.1)
BASOPHILS: 1 %
EOSINOPHILS: 2 %
HCT: 42.2 % (ref 34.0–46.6)
HGB: 13.2 g/dL (ref 11.1–15.9)
IMMATURE GRANULOCYTES: 0 %
Lymphocytes: 29 %
MCH: 26.9 pg (ref 26.6–33.0)
MCHC: 31.3 g/dL — ABNORMAL LOW (ref 31.5–35.7)
MCV: 86 fL (ref 79–97)
MONOCYTES: 5 %
NEUTROPHILS: 63 %
PLATELET: 398 10*3/uL (ref 150–450)
RBC: 4.9 x10E6/uL (ref 3.77–5.28)
RDW: 13.2 % (ref 11.7–15.4)
WBC: 11.3 10*3/uL — ABNORMAL HIGH (ref 3.4–10.8)

## 2020-09-02 LAB — HEPATITIS C AB: Hep C Virus Ab: <0.1 s/co ratio (ref 0.0–0.9)

## 2020-09-02 LAB — METABOLIC PANEL, BASIC
BUN/Creatinine ratio: 10 (ref 9–23)
BUN: 6 mg/dL (ref 6–20)
CO2: 23 mmol/L (ref 20–29)
Calcium: 9.8 mg/dL (ref 8.7–10.2)
Chloride: 104 mmol/L (ref 96–106)
Creatinine: 0.63 mg/dL (ref 0.57–1.00)
GFR est AA: 132 mL/min/{1.73_m2} (ref >59)
GFR est non-AA: 114 mL/min/{1.73_m2} (ref >59)
Glucose: 95 mg/dL (ref 65–99)
Potassium: 4.3 mmol/L (ref 3.5–5.2)
Sodium: 141 mmol/L (ref 134–144)

## 2020-09-02 LAB — HEMOGLOBIN A1C WITH EAG
Estimated average glucose: 111 mg/dL
Hemoglobin A1c: 5.5 % (ref 4.8–5.6)

## 2020-09-02 NOTE — Telephone Encounter (Signed)
Pt. Left a vm message. She had an appointment this past Thursday. She left and forgot to pay her bill. Would like to pay it right now. Please call her back at [phone].

## 2020-09-02 NOTE — Telephone Encounter (Signed)
Pt. Left vm saying pharmacy called her because there was a problem with one of her Rx.    I called [facility] pharmacy [phone]. The specifications for:   Rx Clonazepam (Klonopin) 2 mg tab     Take 1 Tablet by mouth nightly for 30 days. Max Daily Amount: 2 mg. Take 1 and 1/2 pills at night, each night.    Could you please correct the instructions? Pharmacy wants to know if it's 1 tab po hs, or 1 and 1/2 tabs po at night?    Please advice.     Thanks.

## 2020-09-03 NOTE — Telephone Encounter (Signed)
[facility] pharmacy at [phone]. As per doctor's instructions, specified sig as 1.5 tabs po nightly. Pharmacy suggested also the Qty to be changed to 45 tabs.     Will update chart to reflect this change for future orders.

## 2020-09-09 ENCOUNTER — Encounter: Attending: Medical | Primary: Internal Medicine

## 2020-09-15 ENCOUNTER — Ambulatory Visit: Admit: 2020-09-15 | Discharge: 2020-09-15 | Attending: Internal Medicine | Primary: Internal Medicine

## 2020-09-15 ENCOUNTER — Ambulatory Visit: Attending: Internal Medicine | Primary: Internal Medicine

## 2020-09-15 DIAGNOSIS — F3341 Major depressive disorder, recurrent, in partial remission: Secondary | ICD-10-CM

## 2020-09-15 NOTE — ACP (Advance Care Planning) (Signed)
Pt has no living will or poa

## 2020-09-15 NOTE — Progress Notes (Signed)
Progress Notes by Skyler Dusing, [place] J, DO at 09/15/20 1400                Author: Jaiceon Collister, [place] J, DO  Service: --  Author Type: Physician       Filed: 09/22/20 1503  Encounter Date: 09/15/2020  Status: Addendum          Editor: David Rodriquez, [place] J, DO (Physician)          Related Notes: Original Note by Sable Knoles, [place] J, DO (Physician) filed at 09/15/20 1417                    [place] Enyla Lisbon, D.O.    [facility]   [address]   [place] [id number]   Tel: [phone]        Office Visit: Follow Up                 Patient Name:  Karen MoodSarah Logan     DOB:   [birth year]        MRN:    [id number]         Today's Date: 09/15/20 1:48 PM        Subjective        The patient is a 39 y.o. year old female with a pmh as listed below. She presents today for follow up on depression, obesity/weight  gain, and hypertension. Patient states she is doing well with no acute complaints. Last visit the patient was switched from Lisinopril to Losartan which is working well for her now. She states the Omeprazole she started last visit is also working very  well and has resolved her symptoms. She is waiting on a psych appointment and is scheduled to see ENT on October 11. [facility] [place], [place], [place], [place], and [place].       Review of Systems    Constitutional: Negative.     HENT: Positive for tinnitus. Negative for ear discharge, ear pain, hearing loss and nosebleeds.     Eyes: Negative.     Respiratory: Negative for shortness of breath.     Cardiovascular: Negative for chest pain, palpitations and orthopnea.    Gastrointestinal: Negative for abdominal pain, constipation, diarrhea, nausea and vomiting.    Genitourinary: Positive for frequency. Negative for dysuria, flank pain, hematuria and urgency.    Musculoskeletal: Positive for back pain. Negative for myalgias.    Skin: Negative.     Neurological: Negative for dizziness, weakness and headaches.     Psychiatric/Behavioral: Positive for depression. The patient is nervous/anxious .           Past Medical History:      Diagnosis  Date      ?  Allergies        ?  Anxiety        ?  Bilateral calf pain  09/01/2020      ?  Bladder problem        ?  Carpal tunnel syndrome        ?  Class 3 severe obesity without serious comorbidity with body mass index (BMI) of 40.0 to 44.9 in adult [facility](HCC)  09/01/2020      ?  Depression        ?  Encounter to establish care  09/01/2020      ?  Hypertension        ?  Tinnitus of both ears  09/01/2020  Social History            Socioeconomic History      ?  Marital status:  MARRIED          Spouse name:  Not on file      ?  Number of children:  Not on file      ?  Years of education:  Not on file      ?  Highest education level:  Not on file      Occupational History      ?  Not on file      Tobacco Use      ?  Smoking status:  Never Smoker      ?  Smokeless tobacco:  Never Used      Vaping Use      ?  Vaping Use:  Never used      Substance and Sexual Activity      ?  Alcohol use:  Never      ?  Drug use:  Never      ?  Sexual activity:  Yes      Other Topics  Concern      ?  Not on file      Social History Narrative      ?  Not on file            Social Determinants of Health            Financial Resource Strain:       ?  Difficulty of Paying Living Expenses:       Food Insecurity:       ?  Worried About Programme researcher, broadcasting/film/video in the Last Year:       ?  Barista in the Last Year:       Transportation Needs:       ?  Freight forwarder (Medical):       ?  Lack of Transportation (Non-Medical):       Physical Activity:       ?  Days of Exercise per Week:       ?  Minutes of Exercise per Session:       Stress:       ?  Feeling of Stress :       Social Connections:       ?  Frequency of Communication with Friends and Family:       ?  Frequency of Social Gatherings with Friends and Family:       ?  Attends Religious Services:       ?  Active Member of Clubs or Organizations:        ?  Attends Banker Meetings:       ?  Marital Status:       Intimate Partner Violence:       ?  Fear of Current or Ex-Partner:       ?  Emotionally Abused:       ?  Physically Abused:       ?  Sexually Abused:                     Current Outpatient Medications        Medication  Sig         ?  sertraline (ZOLOFT) 100 mg tablet  Take 1 Tablet by mouth daily for 30 days.     ?  clonazePAM (KlonoPIN) 2 mg tablet  Take 1 Tablet by mouth nightly for 30 days. Max Daily Amount: 2 mg. Take 1 and 1/2 pills at night, each night.     ?  losartan (COZAAR) 25 mg tablet  Take 2 Tablets by mouth daily for 30 days.         ?  omeprazole (PRILOSEC) 20 mg capsule  Take 1 Capsule by mouth daily for 30 days.          No current facility-administered medications for this visit.              Objective          Vitals:          09/15/20 1346        BP:  132/72     BP 1 Location:  Right upper arm     BP Patient Position:  Sitting     BP Cuff Size:  Large adult     Pulse:  67     Temp:  98.5 ??F (36.9 ??C)     TempSrc:  Temporal     Height:  5\' 4"  (1.626 m)     Weight:  245 lb 6.4 oz (111.3 kg)        SpO2:  97%            Physical Exam   Vitals and nursing note reviewed.   Constitutional:        General: She is not in acute distress.     Appearance: Normal appearance. She is obese. She is not ill-appearing, toxic-appearing  or diaphoretic.   HENT :       Head: Normocephalic and atraumatic.      Nose: Nose normal.   Cardiovascular :       Rate and Rhythm: Normal rate and regular rhythm.      Pulses: Normal pulses.      Heart sounds: Normal heart sounds. No murmur heard.   No friction rub. No gallop.    Pulmonary:       Effort: Pulmonary effort is normal. No respiratory distress.      Breath sounds: Normal breath sounds. No stridor. No wheezing, rhonchi  or rales.   Chest :       Chest wall: No tenderness.   Abdominal :      General: Abdomen is flat. Bowel sounds are normal. There is no distension.      Palpations: Abdomen is  soft.      Tenderness: There is no abdominal tenderness.    Skin:      General: Skin is warm and dry.   Neurological :       General: No focal deficit present.      Mental Status: She is alert and oriented to person, place, and time. Mental status is at baseline.   Psychiatric:         Logan and Affect: Logan normal.         Behavior: Behavior normal.         Thought Content:  Thought content normal.         Judgment: Judgment normal.                Recommendations        Assessment:     Patient Active Problem List        Diagnosis  Code         ?  Encounter to establish care  Z76.89     ?  Depression  F32.A     ?  Hypertension  I10     ?  Tinnitus of both ears  H93.13     ?  Class 3 severe obesity without serious comorbidity with body mass index (BMI) of 40.0 to 44.9 in adult (HCC)  E66.01, Z68.41         ?  Bilateral calf pain  M79.661, M79.662            Plan:   -we will obtain records from her previous PCP regarding Pap Smear history    -She will follow up with ENT 10/11     -We will obtain her ob/gyn records   -She is to continue her current medication regimen as prescribed    -She states her lower extremity symptoms have resolved, and no longer needs the U/S   -Follow up in 6 months         Time Spent in Patient Room: 15 minutes   Time Spent Pre- and Post Reviewing patient's chart: 15 minutes   Total Time: 30 minutes       Signed: [place] Quinterious Walraven, D.O.   09/15/20   1:48 PM

## 2020-09-16 DIAGNOSIS — H5213 Myopia, bilateral: Secondary | ICD-10-CM | POA: Diagnosis not present

## 2020-09-20 ENCOUNTER — Ambulatory Visit: Attending: Medical | Primary: Internal Medicine

## 2020-09-20 NOTE — Telephone Encounter (Signed)
Patient was a no show today, I called and left a message.

## 2020-09-30 ENCOUNTER — Encounter

## 2020-09-30 MED ORDER — SERTRALINE 100 MG TAB
100 mg | ORAL_TABLET | Freq: Every day | ORAL | 0 refills | Status: DC
Start: 2020-09-30 — End: 2020-11-01

## 2020-09-30 MED ORDER — OMEPRAZOLE 20 MG CAP, DELAYED RELEASE
20 mg | ORAL_CAPSULE | Freq: Every day | ORAL | 0 refills | Status: DC
Start: 2020-09-30 — End: 2020-11-01

## 2020-09-30 MED ORDER — CLONAZEPAM 2 MG TAB
2 mg | ORAL_TABLET | Freq: Every evening | ORAL | 0 refills | Status: DC
Start: 2020-09-30 — End: 2020-10-05

## 2020-09-30 MED ORDER — LOSARTAN 25 MG TAB
25 mg | ORAL_TABLET | Freq: Every day | ORAL | 0 refills | Status: DC
Start: 2020-09-30 — End: 2020-11-01

## 2020-10-01 ENCOUNTER — Encounter

## 2020-10-05 MED ORDER — CLONAZEPAM 2 MG TAB
2 mg | ORAL_TABLET | ORAL | 0 refills | Status: DC
Start: 2020-10-05 — End: 2020-11-01

## 2020-10-05 NOTE — Telephone Encounter (Signed)
Patient called and stated that there are two different directions being sent in for her medication klonopin, patient just needs to verify how she is suppose to take it.       Patient left 2 vm yesterday asking for a call back.

## 2020-11-01 ENCOUNTER — Encounter

## 2020-11-01 MED ORDER — OMEPRAZOLE 20 MG CAP, DELAYED RELEASE
20 mg | ORAL_CAPSULE | Freq: Every day | ORAL | 0 refills | Status: AC
Start: 2020-11-01 — End: 2020-12-01

## 2020-11-01 MED ORDER — SERTRALINE 100 MG TAB
100 mg | ORAL_TABLET | Freq: Every day | ORAL | 0 refills | Status: DC
Start: 2020-11-01 — End: 2020-11-09

## 2020-11-01 MED ORDER — LOSARTAN 25 MG TAB
25 mg | ORAL_TABLET | Freq: Every day | ORAL | 0 refills | Status: DC
Start: 2020-11-01 — End: 2020-12-01

## 2020-11-01 MED ORDER — CLONAZEPAM 2 MG TAB
2 mg | ORAL_TABLET | ORAL | 0 refills | Status: DC
Start: 2020-11-01 — End: 2020-11-09

## 2020-11-03 DIAGNOSIS — R310 Gross hematuria: Secondary | ICD-10-CM | POA: Diagnosis not present

## 2020-11-03 DIAGNOSIS — F411 Generalized anxiety disorder: Secondary | ICD-10-CM | POA: Diagnosis not present

## 2020-11-03 DIAGNOSIS — R1030 Lower abdominal pain, unspecified: Secondary | ICD-10-CM | POA: Diagnosis not present

## 2020-11-03 DIAGNOSIS — F419 Anxiety disorder, unspecified: Secondary | ICD-10-CM | POA: Diagnosis not present

## 2020-11-03 DIAGNOSIS — K5900 Constipation, unspecified: Secondary | ICD-10-CM | POA: Diagnosis not present

## 2020-11-03 DIAGNOSIS — Z0000 Encounter for general adult medical examination without abnormal findings: Secondary | ICD-10-CM | POA: Diagnosis not present

## 2020-11-03 DIAGNOSIS — E663 Overweight: Secondary | ICD-10-CM | POA: Diagnosis not present

## 2020-11-09 ENCOUNTER — Telehealth: Admit: 2020-11-09 | Discharge: 2020-11-09 | Attending: Psychiatry | Primary: Internal Medicine

## 2020-11-09 ENCOUNTER — Ambulatory Visit: Payer: BC Managed Care – PPO | Admitting: Urology

## 2020-11-09 ENCOUNTER — Telehealth: Attending: Psychiatry | Primary: Internal Medicine

## 2020-11-09 DIAGNOSIS — F332 Major depressive disorder, recurrent severe without psychotic features: Secondary | ICD-10-CM

## 2020-11-09 DIAGNOSIS — Z0001 Encounter for general adult medical examination with abnormal findings: Secondary | ICD-10-CM | POA: Diagnosis not present

## 2020-11-09 MED ORDER — BUPROPION XL 150 MG 24 HR TAB
150 mg | ORAL_TABLET | ORAL | 3 refills | Status: DC
Start: 2020-11-09 — End: 2021-03-01

## 2020-11-09 MED ORDER — SERTRALINE 100 MG TAB
100 mg | ORAL_TABLET | Freq: Every day | ORAL | 3 refills | Status: DC
Start: 2020-11-09 — End: 2021-01-20

## 2020-11-09 MED ORDER — CLONAZEPAM 2 MG TAB
2 mg | ORAL_TABLET | Freq: Every evening | ORAL | 3 refills | Status: DC
Start: 2020-11-09 — End: 2020-12-20
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
## 2020-11-12 NOTE — Telephone Encounter (Signed)
Patient messaged through MyChart stating that she woke up with excessive nasal drainage down the back of her throat and a sewage-like taste in her mouth with profound weakness.  She states that she thinks she might have COVID-19 and was wondering if she could be seen today.  I informed patient that if she is suspecting COVID-19 to go and get tested at an urgent care and that if she is experiencing severe shortness of breath or any other severe symptoms she is to go to the emergency room as soon as possible.  I instructed the patient to message me on Monday for results of her Covid test and if it is negative she can make an appointment to see me next week.

## 2020-11-12 NOTE — Assessment & Plan Note (Signed)
Patient messaged through MyChart stating that she woke up with excessive nasal drainage down the back of her throat and a sewage-like taste in her mouth with profound weakness.  She states that she thinks she might have COVID-19 and was wondering if she could be seen today.  I informed patient that if she is suspecting COVID-19 to go and get tested at an urgent care and that if she is experiencing severe shortness of breath or any other severe symptoms she is to go to the emergency room as soon as possible.  I instructed the patient to message me on Monday for results of her Covid test and if it is negative she can make an appointment to see me next week.

## 2020-11-17 DIAGNOSIS — S43401A Unspecified sprain of right shoulder joint, initial encounter: Secondary | ICD-10-CM | POA: Diagnosis not present

## 2020-11-17 DIAGNOSIS — S338XXA Sprain of other parts of lumbar spine and pelvis, initial encounter: Secondary | ICD-10-CM | POA: Diagnosis not present

## 2020-11-17 DIAGNOSIS — S233XXA Sprain of ligaments of thoracic spine, initial encounter: Secondary | ICD-10-CM | POA: Diagnosis not present

## 2020-11-17 DIAGNOSIS — S134XXA Sprain of ligaments of cervical spine, initial encounter: Secondary | ICD-10-CM | POA: Diagnosis not present

## 2020-11-25 DIAGNOSIS — S134XXA Sprain of ligaments of cervical spine, initial encounter: Secondary | ICD-10-CM | POA: Diagnosis not present

## 2020-11-25 DIAGNOSIS — S233XXA Sprain of ligaments of thoracic spine, initial encounter: Secondary | ICD-10-CM | POA: Diagnosis not present

## 2020-11-25 DIAGNOSIS — S338XXA Sprain of other parts of lumbar spine and pelvis, initial encounter: Secondary | ICD-10-CM | POA: Diagnosis not present

## 2020-11-25 DIAGNOSIS — S43401A Unspecified sprain of right shoulder joint, initial encounter: Secondary | ICD-10-CM | POA: Diagnosis not present

## 2020-11-30 ENCOUNTER — Ambulatory Visit: Admit: 2020-11-30 | Discharge: 2020-11-30 | Attending: Internal Medicine | Primary: Internal Medicine

## 2020-11-30 ENCOUNTER — Ambulatory Visit: Attending: Internal Medicine | Primary: Internal Medicine

## 2020-11-30 NOTE — Progress Notes (Signed)
Progress Notes by Nicholson Starace, [place] J, DO at 11/30/20 1300                Author: Tionne Carelli, [place] J, DO  Service: --  Author Type: Physician       Filed: 11/30/20 1320  Encounter Date: 11/30/2020  Status: Signed          Editor: Steaven Wholey, [place] J, DO (Physician)                    [place] Nyaisha Simao, D.O.    [facility]   [address]   [place], [place] [id number]   Tel: [phone]        Follow Up Office Visit           Patient Name:  Karen MoodSarah Logan      DOB:   09-10-1981        MRN:    [id number]         Today's Date: 11/30/20 1:00 PM        Subjective        The patient is a 39 y.o. year old female with a pmh as listed below. She presents today for HTN, anxiety/depression, obesity, and  COVID-19 follow up. She states she feels a lot better than when she had COVID-19. She did see psych and was put on Bupropion 150 XL and she has been taking half and states that it working well. She is also seeing a therapist. She states she did not see  ENT yet. She will try to reschedule. She states she also has nystagmus when she reads.       Review of Systems    Constitutional: Negative.     HENT: Positive for tinnitus.     Eyes: Negative.     Respiratory: Negative.     Cardiovascular: Negative.     Gastrointestinal: Positive for heartburn.    Genitourinary: Negative.     Musculoskeletal: Negative.     Neurological: Negative.     Endo/Heme/Allergies: Negative.     Psychiatric/Behavioral: Positive for depression. The patient is nervous/anxious .           Past Medical History:      Diagnosis  Date      ?  Allergies        ?  Anxiety        ?  Bilateral calf pain  09/01/2020      ?  Bladder problem        ?  Carpal tunnel syndrome        ?  Class 3 severe obesity without serious comorbidity with body mass index (BMI) of 40.0 to 44.9 in adult [facility](HCC)  09/01/2020      ?  Depression        ?  Encounter to establish care  09/01/2020      ?  Headache        ?  Hypertension        ?  Tinnitus of both ears  09/01/2020             Social History            Socioeconomic History      ?  Marital status:  MARRIED          Spouse name:  Not on file      ?  Number of children:  Not on file      ?  Years of education:  Not on file      ?  Highest education level:  Not on file      Occupational History      ?  Not on file      Tobacco Use      ?  Smoking status:  Never Smoker      ?  Smokeless tobacco:  Never Used      Vaping Use      ?  Vaping Use:  Never used      Substance and Sexual Activity      ?  Alcohol use:  Never      ?  Drug use:  Never      ?  Sexual activity:  Yes      Other Topics  Concern      ?  Not on file      Social History Narrative      ?  Not on file            Social Determinants of Health            Financial Resource Strain:       ?  Difficulty of Paying Living Expenses: Not on file      Food Insecurity:       ?  Worried About Running Out of Food in the Last Year: Not on file      ?  Ran Out of Food in the Last Year: Not on file      Transportation Needs:       ?  Lack of Transportation (Medical): Not on file      ?  Lack of Transportation (Non-Medical): Not on file      Physical Activity:       ?  Days of Exercise per Week: Not on file      ?  Minutes of Exercise per Session: Not on file      Stress:       ?  Feeling of Stress : Not on file      Social Connections:       ?  Frequency of Communication with Friends and Family: Not on file      ?  Frequency of Social Gatherings with Friends and Family: Not on file      ?  Attends Religious Services: Not on file      ?  Active Member of Clubs or Organizations: Not on file      ?  Attends Banker Meetings: Not on file      ?  Marital Status: Not on file      Intimate Partner Violence:       ?  Fear of Current or Ex-Partner: Not on file      ?  Emotionally Abused: Not on file      ?  Physically Abused: Not on file      ?  Sexually Abused: Not on file      Housing Stability:       ?  Unable to Pay for Housing in the Last Year: Not on file      ?  Number of Places  Lived in the Last Year: Not on file      ?  Unstable Housing in the Last Year: Not on file                    Current Outpatient Medications        Medication  Sig         ?  PNV no.95/ferrous fum/folic ac (PRENATAL PO)  Take  by mouth.     ?  ascorbic acid (VITAMIN C PO)  Take  by mouth.     ?  cholecalciferol (VITAMIN D3) (5000 Units/125 mcg) tab tablet  Take  by mouth daily.     ?  cyanocobalamin, vitamin B-12, (VITAMIN B-12 PO)  Take  by mouth.     ?  sertraline (ZOLOFT) 100 mg tablet  Take 1.5 Tablets by mouth daily.     ?  clonazePAM (KlonoPIN) 2 mg tablet  Take 1 Tablet by mouth nightly. Max Daily Amount: 2 mg.     ?  buPROPion XL (WELLBUTRIN XL) 150 mg tablet  Take 1 Tablet by mouth every morning.     ?  losartan (COZAAR) 25 mg tablet  Take 2 Tablets by mouth daily for 30 days.         ?  omeprazole (PRILOSEC) 20 mg capsule  Take 1 Capsule by mouth daily for 30 days.          No current facility-administered medications for this visit.              Objective        There were no vitals filed for this visit.       Physical Exam:   Constitutional: Appears well kempt. Alert/oriented x3. In no acute distress.   Head: Normocephalic No trauma. No deformity. No bruits.    Neck: Supple. ROM normal. No tenderness. No masses.   Eyes: PERRLA. Conjunctivae normal. No discharge.   Ears: External ears normal. TM normal. No discharge from ears.    Nose: Nose normal. Nares patent.    Throat: Clear. No exudates. No erythema.    Cardiac: Heart with normal rate/rhythm. No murmurs. No gallops. Pulses normal.    Pulmonary: Lungs clear to auscultation bilaterally. In no respiratory distress. No wheezing. No rales. No rhonci.    Gastrointestinal: Bowel sounds present. Abdomen soft and nondistended.    Musculoskeletal: Moves all extremities with good ROM. Non-tender. No swelling. No edema.    Skin: No rashes or abnormal moles. No lesions.    Neurological: No numbness. No tingling. Alert and oriented. At baseline. No confusion.     Psychiatric: Normal thought content. Normal behavior. Normal judgment.         Recommendations        Assessment:     Patient Active Problem List        Diagnosis  Code         ?  Depression  F32.A     ?  Hypertension  I10     ?  Tinnitus of both ears  H93.13         ?  Class 3 severe obesity without serious comorbidity with body mass index (BMI) of 40.0 to 44.9 in adult (HCC)  E66.01, Z68.41            Plan:   -continue current treatment regimen    -referral to neurology   -she will call ENT to set up her appointment for tinnitus and audiology   -The patient expresses understanding of the plan as I've explained it to her and is in agreement  with the current plan.   -Follow up in 1 year      Time Spent in Patient Room: 15 minutes   Time Spent Pre- and Post Reviewing patient's chart: 15 minutes  Total Time: 30 minutes      Signed: [place] Rayette Mogg, D.O.   11/30/20   1:00 PM

## 2020-12-01 ENCOUNTER — Encounter

## 2020-12-01 DIAGNOSIS — S43401A Unspecified sprain of right shoulder joint, initial encounter: Secondary | ICD-10-CM | POA: Diagnosis not present

## 2020-12-01 DIAGNOSIS — S134XXA Sprain of ligaments of cervical spine, initial encounter: Secondary | ICD-10-CM | POA: Diagnosis not present

## 2020-12-01 DIAGNOSIS — S338XXA Sprain of other parts of lumbar spine and pelvis, initial encounter: Secondary | ICD-10-CM | POA: Diagnosis not present

## 2020-12-01 DIAGNOSIS — S233XXA Sprain of ligaments of thoracic spine, initial encounter: Secondary | ICD-10-CM | POA: Diagnosis not present

## 2020-12-01 MED ORDER — LOSARTAN 25 MG TAB
25 mg | ORAL_TABLET | Freq: Every day | ORAL | 0 refills | Status: DC
Start: 2020-12-01 — End: 2020-12-20

## 2020-12-01 NOTE — Telephone Encounter (Signed)
Patient is needing a refill before she leaves for vacation.

## 2020-12-08 DIAGNOSIS — S338XXA Sprain of other parts of lumbar spine and pelvis, initial encounter: Secondary | ICD-10-CM | POA: Diagnosis not present

## 2020-12-08 DIAGNOSIS — S43401A Unspecified sprain of right shoulder joint, initial encounter: Secondary | ICD-10-CM | POA: Diagnosis not present

## 2020-12-08 DIAGNOSIS — S233XXA Sprain of ligaments of thoracic spine, initial encounter: Secondary | ICD-10-CM | POA: Diagnosis not present

## 2020-12-08 DIAGNOSIS — S134XXA Sprain of ligaments of cervical spine, initial encounter: Secondary | ICD-10-CM | POA: Diagnosis not present

## 2020-12-17 ENCOUNTER — Ambulatory Visit (HOSPITAL_COMMUNITY)
Admission: RE | Admit: 2020-12-17 | Discharge: 2020-12-17 | Disposition: A | Discharge status: Home / Self Care | Payer: BC Managed Care – PPO | Source: Ambulatory Visit | Attending: Urology | Admitting: Urology

## 2020-12-17 ENCOUNTER — Other Ambulatory Visit: Payer: Self-pay

## 2020-12-17 DIAGNOSIS — I878 Other specified disorders of veins: Secondary | ICD-10-CM | POA: Diagnosis not present

## 2020-12-17 DIAGNOSIS — Q7649 Other congenital malformations of spine, not associated with scoliosis: Secondary | ICD-10-CM | POA: Diagnosis not present

## 2020-12-17 DIAGNOSIS — N200 Calculus of kidney: Secondary | ICD-10-CM

## 2020-12-20 ENCOUNTER — Encounter

## 2020-12-20 ENCOUNTER — Encounter: Payer: Self-pay | Admitting: Urology

## 2020-12-20 ENCOUNTER — Ambulatory Visit (INDEPENDENT_AMBULATORY_CARE_PROVIDER_SITE_OTHER): Payer: BC Managed Care – PPO | Admitting: Urology

## 2020-12-20 ENCOUNTER — Other Ambulatory Visit: Payer: Self-pay

## 2020-12-20 VITALS — BP 111/74 | HR 76 | Temp 98.4°F | Ht 62.0 in | Wt 140.0 lb

## 2020-12-20 DIAGNOSIS — N200 Calculus of kidney: Secondary | ICD-10-CM | POA: Diagnosis not present

## 2020-12-20 LAB — URINALYSIS, ROUTINE W REFLEX MICROSCOPIC
Bilirubin, UA: NEGATIVE
Glucose, UA: NEGATIVE
Ketones, UA: NEGATIVE
Leukocytes,UA: NEGATIVE
Nitrite, UA: NEGATIVE
Protein,UA: NEGATIVE
Specific Gravity, UA: 1.015 (ref 1.005–1.030)
Urobilinogen, Ur: 0.2 mg/dL (ref 0.2–1.0)
pH, UA: 5.5 (ref 5.0–7.5)

## 2020-12-20 LAB — MICROSCOPIC EXAMINATION: Renal Epithel, UA: NONE SEEN /hpf

## 2020-12-20 MED ORDER — CLONAZEPAM 2 MG TAB
2 mg | ORAL_TABLET | Freq: Every evening | ORAL | 3 refills | Status: DC
Start: 2020-12-20 — End: 2021-02-01

## 2020-12-20 MED ORDER — LOSARTAN 25 MG TAB
25 mg | ORAL_TABLET | Freq: Every day | ORAL | 0 refills | Status: DC
Start: 2020-12-20 — End: 2021-01-31

## 2020-12-20 NOTE — Patient Instructions (Signed)

## 2020-12-20 NOTE — Progress Notes (Signed)
12/20/2020 4:04 PM   Shelby Parker [birth year] [id number]  Referring provider: Benita Stabile, MD [address] [place],  [place] [address]  Nephrolithiasis  HPI: Shelby Parker is a 39yo here for followup for nephrolithiasis. She drinks over 64oz of water daily. She eats a lot of red meat. NO stone events since last visit. KUB shows no calculi. She denies any LUTS. No other complaints today   PMH: Past Medical History:  Diagnosis Date  . Nephrolithiasis   . Renal disorder    kidney stones    Surgical History: Past Surgical History:  Procedure Laterality Date  . ABDOMINAL HYSTERECTOMY    . EXTRACORPOREAL SHOCK WAVE LITHOTRIPSY Left 07/26/2020   Procedure: EXTRACORPOREAL SHOCK WAVE LITHOTRIPSY (ESWL);  Surgeon: Bjorn Pippin, MD;  Location[facility];  Service: Urology;  Laterality: Left;    Home Medications:  Allergies as of 12/20/2020      Reactions   Codeine    Hydrocodone       Medication List       Accurate as of December 20, 2020  4:04 PM. If you have any questions, ask your nurse or doctor.        buPROPion 100 MG 12 hr tablet Commonly known as: WELLBUTRIN SR Take 100 mg by mouth every morning.   diclofenac 75 MG EC tablet Commonly known as: VOLTAREN Take 75 mg by mouth 2 (two) times daily.   ketorolac 10 MG tablet Commonly known as: TORADOL Take 1 tablet (10 mg total) by mouth every 8 (eight) hours as needed.   ondansetron 4 MG tablet Commonly known as: ZOFRAN Take 1 tablet (4 mg total) by mouth every 8 (eight) hours as needed for nausea or vomiting.   oxyCODONE-acetaminophen 5-325 MG tablet Commonly known as: Percocet Take 1 tablet by mouth every 4 (four) hours as needed for moderate pain or severe pain.   tamsulosin 0.4 MG Caps capsule Commonly known as: Flomax Take 1 capsule (0.4 mg total) by mouth daily.   traMADol 50 MG tablet Commonly known as: ULTRAM Take 50 mg by mouth 2 (two) times daily as needed for severe pain.        Allergies:  Allergies  Allergen Reactions  . Codeine   . Hydrocodone     Family History: No family history on file.  Social History:  reports that she has never smoked. She has never used smokeless tobacco. She reports that she does not drink alcohol and does not use drugs.  ROS: All other review of systems were reviewed and are negative except what is noted above in HPI  Physical Exam: BP 111/74   Pulse 76   Temp 98.4 F (36.9 C)   Ht 5\' 2"  (1.575 m)   Wt 140 lb (63.5 kg)   LMP 07/22/2010   BMI 25.61 kg/m   Constitutional:  Alert and oriented, No acute distress. HEENT: [place] AT, moist mucus membranes.  Trachea midline, no masses. Cardiovascular: No clubbing, cyanosis, or edema. Respiratory: Normal respiratory effort, no increased work of breathing. GI: Abdomen is soft, nontender, nondistended, no abdominal masses GU: No CVA tenderness.  Lymph: No cervical or inguinal lymphadenopathy. Skin: No rashes, bruises or suspicious lesions. Neurologic: Grossly intact, no focal deficits, moving all 4 extremities. Psychiatric: Normal mood and affect.  Laboratory Data: Lab Results  Component Value Date   WBC 11.5 (H) 10/12/2010   HGB 8.7 (L) 10/12/2010   HCT 25.1 (L) 10/12/2010   MCV 88.0 10/12/2010  PLT 190 10/12/2010    No results found for: CREATININE  No results found for: PSA  No results found for: TESTOSTERONE  No results found for: HGBA1C  Urinalysis    Component Value Date/Time   COLORURINE YELLOW 07/21/2020 1312   APPEARANCEUR Clear 08/13/2020 1432   LABSPEC 1.014 07/21/2020 1312   PHURINE 6.0 07/21/2020 1312   GLUCOSEU Negative 08/13/2020 1432   HGBUR MODERATE (A) 07/21/2020 1312   BILIRUBINUR Negative 08/13/2020 1432   KETONESUR NEGATIVE 07/21/2020 1312   PROTEINUR Negative 08/13/2020 1432   PROTEINUR NEGATIVE 07/21/2020 1312   NITRITE Negative 08/13/2020 1432   NITRITE NEGATIVE 07/21/2020 1312   LEUKOCYTESUR Negative 08/13/2020 1432    LEUKOCYTESUR NEGATIVE 07/21/2020 1312    Lab Results  Component Value Date   LABMICR See below: 08/13/2020   WBCUA 0-5 08/13/2020   LABEPIT 0-10 08/13/2020   BACTERIA Few (A) 08/13/2020    Pertinent Imaging: KUB 12/17/2020: Images reviewed and discussed with the patient Results for orders placed during the hospital encounter of 12/17/20  Abdomen 1 view (KUB)  Narrative CLINICAL DATA:  Nephrolithiasis.  EXAM: ABDOMEN - 1 VIEW  COMPARISON:  Radiograph 08/13/2020.  Most recent CT 07/21/2020  FINDINGS: Left pelvic calcification represents a phlebolith on prior CT. Previous stone projecting of the lower right kidney is not definitively seen. No visualized renal or ureteral calculi. Normal bowel gas pattern with small to moderate stool burden. Hemi transitional lumbosacral anatomy with enlarged right transverse process of L5 and pseudoarticulation with the sacrum.  IMPRESSION: 1. Previous right renal stone is not definitively seen. No visualized renal or ureteral stones. 2. Left pelvic calcification represents a phlebolith on prior CT.   Electronically Signed By: Narda Rutherford M.D. On: 12/17/2020 21:00  No results found for this or any previous visit.  No results found for this or any previous visit.  No results found for this or any previous visit.  No results found for this or any previous visit.  No results found for this or any previous visit.  No results found for this or any previous visit.  Results for orders placed during the hospital encounter of 07/21/20  CT Renal Stone Study  Narrative CLINICAL DATA:  Left flank pain.  Kidney stones suspected.  EXAM: CT ABDOMEN AND PELVIS WITHOUT CONTRAST  TECHNIQUE: Multidetector CT imaging of the abdomen and pelvis was performed following the standard protocol without IV contrast.  COMPARISON:  None.  FINDINGS: Lower chest: Unremarkable  Hepatobiliary: No focal abnormality in the liver on this  study without intravenous contrast. Possible tiny stone or gallbladder polyp (axial 40/2 and coronal 36/5). No intrahepatic or extrahepatic biliary dilation.  Pancreas: No focal mass lesion. No dilatation of the main duct. No intraparenchymal cyst. No peripancreatic edema.  Spleen: No splenomegaly. No focal mass lesion.  Adrenals/Urinary Tract: No adrenal nodule or mass. 3 x 4 mm nonobstructing stone identified lower pole right kidney. No right ureteral stone. No secondary changes in the right kidney or ureter. No stones are seen in the left kidney although there is mild to moderate left hydroureteronephrosis. Left ureter is dilated down to the pelvis where a 2 x 4 x 8 mm distal left ureteral stone is identified (well demonstrated coronal 57/5 and visible on axial 74/2). Bladder is nondistended.  Stomach/Bowel: Stomach is unremarkable. No gastric wall thickening. No evidence of outlet obstruction. Duodenum is normally positioned as is the ligament of Treitz. No small bowel wall thickening. No small bowel dilatation. No gross colonic mass.  No colonic wall thickening.  Vascular/Lymphatic: No abdominal aortic aneurysm. No abdominal aortic atherosclerotic calcification. There is no gastrohepatic or hepatoduodenal ligament lymphadenopathy. No retroperitoneal or mesenteric lymphadenopathy. No pelvic sidewall lymphadenopathy.  Reproductive: Unremarkable.  Other: No intraperitoneal free fluid.  Musculoskeletal: No worrisome lytic or sclerotic osseous abnormality.  IMPRESSION: 1. 2 x 4 x 8 mm distal left ureteral stone with mild to moderate left hydroureteronephrosis. 2. 3 x 4 mm nonobstructing stone lower pole right kidney. 3. Possible tiny stone or gallbladder polyp.   Electronically Signed By: Kennith Center M.D. On: 07/21/2020 11:44   Assessment & Plan:    1. Nephrolithiasis -RTC 1 year with KUB -Dietary handout given. Patient instructed to increase water intake and  decrease red meat consumptions - Urinalysis, Routine w reflex microscopic   No follow-ups on file.  Wilkie Aye, MD  [facility] [place]

## 2020-12-20 NOTE — Progress Notes (Signed)
Urological Symptom Review  Patient is experiencing the following symptoms: Kidney stones   Review of Systems  Gastrointestinal (upper)  : Negative for upper GI symptoms  Gastrointestinal (lower) : Negative for lower GI symptoms  Constitutional : Negative for symptoms  Skin: Negative for skin symptoms  Eyes: Negative for eye symptoms  Ear/Nose/Throat : Negative for Ear/Nose/Throat symptoms  Hematologic/Lymphatic: Negative for Hematologic/Lymphatic symptoms  Cardiovascular : Negative for cardiovascular symptoms  Respiratory : Negative for respiratory symptoms  Endocrine: Negative for endocrine symptoms  Musculoskeletal: Negative for musculoskeletal symptoms  Neurological: Negative for neurological symptoms  Psychologic: Negative for psychiatric symptoms  

## 2020-12-20 NOTE — Telephone Encounter (Signed)
Patient is needing a refill on klonopin, last refilled 11/30 with 3 refills.    Needs Losartan refilled as well, last refill 12/01/20 with no refills.

## 2021-01-17 NOTE — Telephone Encounter (Signed)
 Patient stated that when she took the whole pill of Wellbutrin  she got extremely agitated and aggravated and brain felt boggled. Started breaking the pill in half and it seemed to work ok for a while but last week started noticing it again that she is getting extremely agitated and aggravated again. Stated that she is taking everything else as prescribed.   Next appt is 2/22.

## 2021-01-20 ENCOUNTER — Encounter

## 2021-01-20 MED ORDER — SERTRALINE 100 MG TAB
100 mg | ORAL_TABLET | Freq: Every day | ORAL | 3 refills | Status: DC
Start: 2021-01-20 — End: 2021-02-01

## 2021-01-20 MED ORDER — BUPROPION SR 100 MG TAB
100 mg | ORAL_TABLET | Freq: Every day | ORAL | 3 refills | Status: DC
Start: 2021-01-20 — End: 2021-02-01

## 2021-01-20 NOTE — Telephone Encounter (Signed)
Notified patient. Verbalized understanding.

## 2021-01-20 NOTE — Telephone Encounter (Signed)
Spoke with patient. This prescription was sent to pharmacy in January with additional refills. Patient is going to contact pharmacy.

## 2021-01-31 ENCOUNTER — Encounter

## 2021-01-31 MED ORDER — LOSARTAN 25 MG TAB
25 mg | ORAL_TABLET | Freq: Every day | ORAL | 2 refills | Status: DC
Start: 2021-01-31 — End: 2021-02-01

## 2021-02-01 ENCOUNTER — Telehealth: Admit: 2021-02-01 | Discharge: 2021-02-01 | Attending: Psychiatry | Primary: Internal Medicine

## 2021-02-01 ENCOUNTER — Encounter

## 2021-02-01 ENCOUNTER — Telehealth: Attending: Psychiatry | Primary: Internal Medicine

## 2021-02-01 DIAGNOSIS — F332 Major depressive disorder, recurrent severe without psychotic features: Secondary | ICD-10-CM

## 2021-02-01 MED ORDER — VALSARTAN 40 MG TAB
40 mg | ORAL_TABLET | Freq: Every day | ORAL | 2 refills | Status: DC
Start: 2021-02-01 — End: 2021-04-13

## 2021-02-01 MED ORDER — SERTRALINE 100 MG TAB
100 mg | ORAL_TABLET | Freq: Every day | ORAL | 3 refills | Status: AC
Start: 2021-02-01 — End: ?

## 2021-02-01 MED ORDER — CLONAZEPAM 1 MG TAB
1 mg | ORAL_TABLET | Freq: Every evening | ORAL | 3 refills | Status: DC
Start: 2021-02-01 — End: 2021-04-27

## 2021-02-01 MED ORDER — BUPROPION SR 100 MG TAB
100 mg | ORAL_TABLET | Freq: Every day | ORAL | 3 refills | Status: DC
Start: 2021-02-01 — End: 2021-04-27

## 2021-02-01 NOTE — Progress Notes (Signed)
Progress  Notes by Daivd CouncilBhatia, Berdina Cheever, MD at 02/01/21 1440                Author: Daivd CouncilBhatia, Terryl Molinelli, MD  Service: --  Author Type: Physician       Filed: 02/01/21 1454  Encounter Date: 02/01/2021  Status: Signed          Editor: Daivd CouncilBhatia, Ammon Muscatello, MD (Physician)               The note has been blocked for the following reason: Likely risk of substantial harm                         **Sensitive Note**                                 Patient:  Karen Logan   Age:  40 y.o.  DOB:  1981-07-13       SEX:  female MRN:   [id number]       RACE: AMERICAN INDI[place]   WHITE/NON-HISPANIC               SEEN:   [x]     PATIENT   []     SPOUSE  []    OTHER:                       [facility]  02/01/2021  11/09/2020  09/01/2020          Little interest or pleasure in doing things  Nearly every day  Nearly every day  Nearly every day     Feeling down, depressed, irritable, or hopeless  Several days  Nearly every day  More than half the days     Total Score PHQ 2  4  6  5      Trouble falling or staying asleep, or sleeping too much  Several days  Nearly every day  Nearly every day     Feeling tired or having little energy  Nearly every day  Nearly every day  Nearly every day     Poor appetite, weight loss, or overeating  Not at all  Nearly every day  Nearly every day     Feeling bad about yourself - or that you are a failure or have let yourself or your family down  Nearly every day  Nearly every day  Nearly every day     Trouble concentrating on things such as school, work, reading, or watching TV  Not at all  Nearly every day  More than half the days     Moving or speaking so slowly that other people could have noticed; or the opposite being so fidgety that others notice  More than half the days  Nearly every day  Nearly every day     Thoughts of being better off dead, or hurting yourself in some way  Not at all  Not at all  Not at all     PHQ 9 Score  13  24  22           How difficult have these problems made it for you to do  your work, take care of your home and get along with others  Very difficult  Extremely difficult  Extremely difficult               GAD 2/7  02/01/2021  11/09/2020  Feeling nervous, anxious, or on edge  2  3     Not being able to stop or control worrying  3  3     Worrying too much about different things  3  3     Trouble relaxing  3  3     Being so restless that it is hard to sit still  3  3     Becoming easily annoyed or irritable  0  0     Feeling afraid as if something awful might happen  3  3         GAD-7 Total Score  17  18           I was at home while conducting this encounter.      Consent:   She and/or her healthcare decision  maker is aware that this patient-initiated Telehealth encounter is a billable service, with coverage as determined by her insurance carrier.  She is aware that she may receive a bill and has provided verbal consent to proceed:  Yes..  Patient identification was verified, and a caregiver was present when  appropriate. The patient was located in a state where the provider was credentialed to provide care.            This virtual visit was conducted via [employer].  Pursuant to the emergency declaration under the [facility] Act and the [employer], 1135 waiver authority and the [employer]  and [employer] Act, this Virtual  Visit was conducted to reduce the patient's risk of exposure to COVID-19 and provide continuity of care for an established patient.  Services were provided through a video synchronous discussion  virtually to substitute for in-person clinic visit.  Due to this being a TeleHealth evaluation, many elements of the physical examination are unable to be assessed.       Total Time: minutes: 11-20 minutes.          Chief complaint:  Pt says her depression is a lot better but she still anxious and still worries.      Subjective:   Seen virtually for follow-up.  States doing the dose of Wellbutrin has helped a lot.  She is  not crying all the time and the weird stuff has calmed down.  She still worries and is always worried.  She  has been moving around and doing more on Wellbutrin and it has helped curb her appetite as well.  But she worries about what will happen to her soul if she dies paying her bills.  Tries to be the best person she can be.  Gets along good with her husband.   They are best they have ever been in [place].  Talks about how [place] being a poor state was not a good place for them to live.  It was dark and gloomy.  And they had to pay a lot more in taxes.  States has not been talking to her brother.   Try to talk to her mom in [place] a couple of times but it did not work.  Mother is [place] and manipulative gives her at girls trip.  Has been more scared to go out.  Takes 2 mg Klonopin in the morning.  Will decrease the dose of Klonopin to 1  mg in the morning.  Mindfulness and various relaxation strategies discussed with her.  She has an appointment with Dr. Shelda Pal in May.  Supportive psychotherapy provided.  She denies suicidal ideation/homicidal ideations.  Denies symptoms psychosis.   COVID-19 precautions discussed with her.  Reassurance provided.        Patient Active Problem List        Diagnosis  Code         ?  Depression  F32.A     ?  Hypertension  I10     ?  Tinnitus of both ears  H93.13         ?  Class 3 severe obesity without serious comorbidity with body mass index (BMI) of 40.0 to 44.9 in adult [facility])  E66.01, Z68.41        LMP 01/13/2021, Birth control,    Not pregnant      Denies palpitation,SOB, Chest pain, headaches. In no acute distress.          MEDICATION REVIEW:      Current Medications:       Current Outpatient Medications        Medication  Sig         ?  valsartan (DIOVAN) 40 mg tablet  Take 1 Tablet by mouth daily for 90 days.     ?  melatonin 5 mg cap capsule  Take  by mouth nightly.     ?  buPROPion SR (WELLBUTRIN SR) 100 mg SR tablet  Take 1 Tablet by mouth daily.      ?  sertraline (ZOLOFT) 100 mg tablet  Take 2 Tablets by mouth daily.     ?  clonazePAM (KlonoPIN) 1 mg tablet  Take 1 Tablet by mouth nightly. Max Daily Amount: 1 mg.     ?  PNV no.95/ferrous fum/folic ac (PRENATAL PO)  Take  by mouth.     ?  ascorbic acid (VITAMIN C PO)  Take  by mouth.     ?  cholecalciferol (VITAMIN D3) (5000 Units/125 mcg) tab tablet  Take  by mouth daily.     ?  cyanocobalamin, vitamin B-12, (VITAMIN B-12 PO)  Take  by mouth.         ?  buPROPion XL (WELLBUTRIN XL) 150 mg tablet  Take 1 Tablet by mouth every morning.          No current facility-administered medications for this visit.             Allergies        Allergen  Reactions         ?  Seasonale [Levonorgestrel-Ethinyl Estrad]  Itching         ?  Lortab [Hydrocodone-Acetaminophen]  Nausea Only and Swelling           Past Medical History, Past Surgical History, Family history, Social History, and Medications were all reviewed with the patient today and updated as necessary.       Compliant with medication:  yes     Side effects from medications:    no      EXAMINATION   Musculoskeletal                GAIT AND STATION    [x]   WNL    []   RESTRICTED    []   UNSTEADY WALK                       []   ABNORMAL    []   UNBALANCED                PSYCHIATRIC  GENERAL APPEARANCE:    '[x]'$     WELL GROOMED  '[]'$        DISHEVELED    '[]'$     UNKEMPT                 '[]'$     UNUSUAL/BIZZARE     '[]'$   WNL                    ATTITUDE:    '[x]'$   COOPERATIVE    '[]'$   GUARDED    '[]'$   SUSPICIOUS                 '[]'$   HOSTILE                                           BEHAVIOR:    '[x]'$   CALM    '[]'$   HYPERACTIVE    '[]'$   MANNERISMS                 '[]'$   BIZZARE                          SPEECH:    '[x]'$   NORMAL FOR CLIENT    '[]'$   SPONTANEOUS    '[]'$   SLURRED    '[]'$   WHISPERING                   '[]'$   LOUD    '[]'$   PRESSURED    '[]'$   ARTICULATE                    EYE CONTACT:    '[x]'$   WNL    '[]'$   BLANK STARE    '[]'$   INTENSE                 '[]'$   AVOIDANT                        MOOD:    '[]'$    EUTHYMIC    '[x]'$   ANXIOUS    '[]'$   DEPRESSED                 '[]'$   IRRITABLE    '[]'$   ANGRY    '[]'$   APATHETIC                AFFECT:    '[x]'$   CONGRUENT WITH MOOD    '[]'$   FLAT    '[]'$   CONSTRICTED                 '[]'$   INAPPROPRIATE    '[]'$   LABILE                                   THOUGHT PROCESS:    '[x]'$   LOGICAL/GOAL-DIRECTED    '[]'$   FOI    '[]'$   CIRCUMSANTIAL                 '[]'$   INCOHERENT    '[]'$   TANGENTIAL    '[]'$   CONCRETE                 '[]'$   PERSEVERATION  THOUGHT CONTENT:                                        DELUSIONS   _0   DENIES   _1   GRANDIOSE   _2   PERSECUTORY   _3   RELIGIOUS   _4   REFERENCE     HALLUCINATIONS   _5   DENIES   _6   AUDITORY   _7   VISUAL   _8   OLFACTORY   _9   TACTILE        _10   GUSTATORY   _11   SOMATIC                 OBSESSIONS   _12   DENIES   _13   PRESENT                 SUICIDAL IDEATION   _14   DENIES   _15   PRESENT W/O PLAN   _16   PRESENT W/ PLAN                         HOMICIDAL IDEATION   _17   DENIES   _18   PRESENT W/O PLAN   _19   PRESENT W/ PLAN                           JUDGEMENT:    _20   GOOD    _21   FAIR    _22   POOR             INSIGHT:    _23   GOOD    _24   FAIR    _25   POOR                  COGNITION:                               SENSORIUM:    _26   ALERT    _27   CLOUDED    _28   DROWSY                   ORIENTATION:    _29   INTACT    _30   TIME:    PLACE    PERSON               RECENT & REMOTE MEMORY:    _31   NORMAL    _32   OTHER:                                                      ATTENTION:    _33   INTACT    _34   MILD IMPAIRMENT    _35   SEVERE IMPAIRMENT                   CONCENTRATION:    _36   INTACT    _37   MILD IMPAIRMENT    _38   SEVERE IMPAIRMENT                   LANGUAGE:    _39   AVERAGE    _40   ABOVE AVERAGE    _41   BELOW AVERAGE         FUND OF KNOWLEDGE:    _42   UNABLE TO ASSESS AT THIS TIME      AVERAGE      ABOVE AVERAGE      BELOW AVERAGE                     GOOD TO EXCELLENT KNOWLEDGE OF CURRENT EVENTS      POOR TO NO KNLEDGE OF CURRENT EVENTS                              ABNORMAL MOVEMENTS:      NONE      TICS      TREMORS      BIZZARE                     FACE      TRUNK      EXTREMETIES      GESTURES                    SLEEP:      GOOD      FAIR      POOR                 MUSCLE STRENGTH AND TONE      WNL      ATROPHY      SPASTIC                      FLACCID      COGWHEEL               Diagnoses/Impressions:             ICD-10-CM  ICD-9-CM             1.  Severe episode of recurrent major depressive disorder, without psychotic features (HCC)   F33.2  296.33  sertraline (ZOLOFT) 100 mg tablet     2.  Generalized anxiety disorder   F41.1  300.02  clonazePAM (KlonoPIN) 1 mg tablet     3.  Insomnia due to mental disorder   F51.05  300.9                327.02             TREATMENT GOALS:   Symptom reduction, Medication adherence, maintain therapeutic gains      LABS/IMAGING:                Ordered     Reviewed     New Labs Ordered: GeneSight test        LAB     WBC          Date/Time  Value  Ref Range  Status          09/01/2020 03:02 PM  11.3 (H)  3.4 - 10.8 x10E3/uL  Final          HGB          Date/Time  Value  Ref Range  Status          09/01/2020 03:02 PM  13.2  11.1 - 15.9 g/dL  Final          HCT          Date/Time  Value  Ref Range  Status          09/01/2020 03:02 PM  42.2  34.0 - 46.6 %  Final          PLATELET  Date/Time  Value  Ref Range  Status          09/01/2020 03:02 PM  398  150 - 450 x10E3/uL  Final          Sodium          Date/Time  Value  Ref Range  Status          09/01/2020 03:02 PM  141  134 - 144 mmol/L  Final          Potassium          Date/Time  Value  Ref Range  Status          09/01/2020 03:02 PM  4.3  3.5 - 5.2 mmol/L  Final          Chloride          Date/Time  Value  Ref Range  Status          09/01/2020 03:02 PM  104  96 - 106 mmol/L  Final          CO2          Date/Time  Value  Ref Range  Status          09/01/2020 03:02 PM  23  20 - 29 mmol/L  Final          BUN          Date/Time  Value  Ref  Range  Status          09/01/2020 03:02 PM  6  6 - 20 mg/dL  Final          Creatinine          Date/Time  Value  Ref Range  Status          09/01/2020 03:02 PM  0.63  0.57 - 1.00 mg/dL  Final          Glucose          Date/Time  Value  Ref Range  Status          09/01/2020 03:02 PM  95  65 - 99 mg/dL  Final          TSH          Date/Time  Value  Ref Range  Status          09/01/2020 03:02 PM  0.818  0.450 - 4.500 uIU/mL  Final           Please refer to the lab tab in the epic and care everywhere for the most recent lab results.      Plan:       [x]    Medication ordered: Wellbutrin, Klonopin, Zoloft to target depression, anxiety, insomnia.      [x]    Medication education/counseling provided   Medication dosage and time to take, purpose/expected benefits/risks, common side effects, lab monitoring required and reason, expected length of treatment, risk of no treatment, effects on pregnancy/nursing,  financial availability. Educated patient on  side effects/risks/benefits of meds including  cardiac arrhythmias, suicidal ideations,orthostatic hypotension, serotonin syndrome, risk of mania/hypomania from antidepressants, withdrawals from abrupt discontinuation  of meds, , risk of bleeding, risk of seizures, addiction potential, memory impairment with long term use of benzos, respiratory depression, high blood pressure, dizziness, drowsiness, sedation , risk of falls, Risk & benefits discussed: including but  not limited to possible off-label medication usage.         [x]   Follow MSE for sxs improvement  I have reviewed the patients controlled substance prescription history, as maintained in the [place] prescription monitoring program, so that the prescription(s) for a  controlled substance can be given.      Recommendations and Referrals:      Follow up with : MD, requires monitoring of response to medication, requires monitoring of medication side effects.      Time until next PMA:       Follow up with  Mental Health Clinicians: psychotherapy interventions, improve level of functioning, monitoring to prevent decompensation /hospitalization, monitoring to maintain therapeutic gains, symptoms (resolving and controlled)               Psychotherapy note:                                __16_ Minutes of psychotherapy       [x]   Supportive psychotherapy, Patient discussed  certain situational and personal stressors ongoing in her life at this time, weight management d/w the patient. Sleep hygiene d/w patient. Patient allowed to vent out her emotions.  Mindfulness and various relaxation strategies discussed with her.   Scenarios were reviewed using role playing and CBT techniques in order to increase insight and decrease anxiety.         []   Disposition planning        []   Dangerous and will not contract for safety  in the community      **Pateint has been notified: They are to call 911 or go to their nearest E.R. if they are experiencing a medical emergency or suicidal ideations/homicidal ideations.**   All ancillary documentation entered reviewed by provider.        PLEASE NOTE:  This document has been produced in part or whole using voice recognition  software. Proofread however unrecognized errors in transcription may be present.            , MD

## 2021-02-08 NOTE — Telephone Encounter (Signed)
Sent order for genesight testing.

## 2021-02-18 ENCOUNTER — Encounter

## 2021-02-18 MED ORDER — BUTALBITAL-ACETAMINOPHEN-CAFFEINE 50 MG-325 MG-40 MG TAB
50-325-40 mg | ORAL_TABLET | Freq: Four times a day (QID) | ORAL | 0 refills | Status: AC | PRN
Start: 2021-02-18 — End: 2021-02-25

## 2021-02-21 ENCOUNTER — Emergency Department: Admit: 2021-02-21 | Primary: Internal Medicine

## 2021-02-21 ENCOUNTER — Telehealth: Admit: 2021-02-21 | Discharge: 2021-02-21 | Attending: Internal Medicine | Primary: Internal Medicine

## 2021-02-21 ENCOUNTER — Inpatient Hospital Stay
Admit: 2021-02-21 | Discharge: 2021-02-21 | Disposition: A | Discharge status: Home / Self Care | Attending: Emergency Medicine

## 2021-02-21 ENCOUNTER — Telehealth: Attending: Internal Medicine | Primary: Internal Medicine

## 2021-02-21 DIAGNOSIS — R519 Headache, unspecified: Secondary | ICD-10-CM

## 2021-02-21 LAB — COMPREHENSIVE METABOLIC PANEL
ALT: 32 U/L (ref 12–65)
AST: 15 U/L (ref 15–37)
Albumin/Globulin Ratio: 0.9 — ABNORMAL LOW (ref 1.2–3.5)
Albumin: 3.4 g/dL — ABNORMAL LOW (ref 3.5–5.0)
Alkaline Phosphatase: 80 U/L (ref 50–136)
Anion Gap: 6 mmol/L — ABNORMAL LOW (ref 7–16)
BUN: 7 MG/DL (ref 6–23)
CO2: 29 mmol/L (ref 21–32)
Calcium: 9.3 MG/DL (ref 8.3–10.4)
Chloride: 106 mmol/L (ref 98–107)
Creatinine: 0.8 MG/DL (ref 0.6–1.0)
EGFR IF NonAfrican American: >60 mL/min/{1.73_m2} (ref >60)
GFR African American: >60 mL/min/{1.73_m2} (ref >60)
Globulin: 3.9 g/dL — ABNORMAL HIGH (ref 2.3–3.5)
Glucose: 100 mg/dL (ref 65–100)
Potassium: 3.7 mmol/L (ref 3.5–5.1)
Sodium: 141 mmol/L (ref 136–145)
Total Bilirubin: 0.3 MG/DL (ref 0.2–1.1)
Total Protein: 7.3 g/dL (ref 6.3–8.2)

## 2021-02-21 LAB — CBC WITH AUTO DIFFERENTIAL
Basophils %: 1 % (ref 0.0–2.0)
Basophils Absolute: 0.1 10*3/uL (ref 0.0–0.2)
Eosinophils %: 3 % (ref 0.5–7.8)
Eosinophils Absolute: 0.3 10*3/uL (ref 0.0–0.8)
Granulocyte Absolute Count: 0 10*3/uL (ref 0.0–0.5)
Hematocrit: 37.8 % (ref 35.8–46.3)
Hemoglobin: 12.1 g/dL (ref 11.7–15.4)
Immature Granulocytes: 0 % (ref 0.0–5.0)
Lymphocytes %: 37 % (ref 13–44)
Lymphocytes Absolute: 3.4 10*3/uL (ref 0.5–4.6)
MCH: 26.6 PG (ref 26.1–32.9)
MCHC: 32 g/dL (ref 31.4–35.0)
MCV: 83.1 FL (ref 79.6–97.8)
MPV: 11.6 FL (ref 9.4–12.3)
Monocytes %: 6 % (ref 4.0–12.0)
Monocytes Absolute: 0.6 10*3/uL (ref 0.1–1.3)
NRBC Absolute: 0 10*3/uL (ref 0.0–0.2)
Neutrophils %: 53 % (ref 43–78)
Neutrophils Absolute: 4.8 10*3/uL (ref 1.7–8.2)
Platelets: 323 10*3/uL (ref 150–450)
RBC: 4.55 M/uL (ref 4.05–5.2)
RDW: 15.3 % — ABNORMAL HIGH (ref 11.9–14.6)
WBC: 9.1 10*3/uL (ref 4.3–11.1)

## 2021-02-21 LAB — HCG URINE, QL. - POC
HCG, Pregnancy, Urine, POC: NEGATIVE
Pregnancy test,urine (POC): NEGATIVE

## 2021-02-21 LAB — METABOLIC PANEL, COMPREHENSIVE
A-G Ratio: 0.9 — ABNORMAL LOW (ref 1.2–3.5)
ALT (SGPT): 32 U/L (ref 12–65)
AST (SGOT): 15 U/L (ref 15–37)
Albumin: 3.4 g/dL — ABNORMAL LOW (ref 3.5–5.0)
Alk. phosphatase: 80 U/L (ref 50–136)
Anion gap: 6 mmol/L — ABNORMAL LOW (ref 7–16)
BUN: 7 MG/DL (ref 6–23)
Bilirubin, total: 0.3 MG/DL (ref 0.2–1.1)
CO2: 29 mmol/L (ref 21–32)
Calcium: 9.3 MG/DL (ref 8.3–10.4)
Chloride: 106 mmol/L (ref 98–107)
Creatinine: 0.8 MG/DL (ref 0.6–1.0)
GFR est AA: >60 mL/min/{1.73_m2} (ref >60)
GFR est non-AA: >60 mL/min/{1.73_m2} (ref >60)
Globulin: 3.9 g/dL — ABNORMAL HIGH (ref 2.3–3.5)
Glucose: 100 mg/dL (ref 65–100)
Potassium: 3.7 mmol/L (ref 3.5–5.1)
Protein, total: 7.3 g/dL (ref 6.3–8.2)
Sodium: 141 mmol/L (ref 136–145)

## 2021-02-21 LAB — CBC WITH AUTOMATED DIFF
ABS. BASOPHILS: 0.1 10*3/uL (ref 0.0–0.2)
ABS. EOSINOPHILS: 0.3 10*3/uL (ref 0.0–0.8)
ABS. IMM. GRANS.: 0 10*3/uL (ref 0.0–0.5)
ABS. LYMPHOCYTES: 3.4 10*3/uL (ref 0.5–4.6)
ABS. MONOCYTES: 0.6 10*3/uL (ref 0.1–1.3)
ABS. NEUTROPHILS: 4.8 10*3/uL (ref 1.7–8.2)
ABSOLUTE NRBC: 0 10*3/uL (ref 0.0–0.2)
BASOPHILS: 1 % (ref 0.0–2.0)
EOSINOPHILS: 3 % (ref 0.5–7.8)
HCT: 37.8 % (ref 35.8–46.3)
HGB: 12.1 g/dL (ref 11.7–15.4)
IMMATURE GRANULOCYTES: 0 % (ref 0.0–5.0)
LYMPHOCYTES: 37 % (ref 13–44)
MCH: 26.6 PG (ref 26.1–32.9)
MCHC: 32 g/dL (ref 31.4–35.0)
MCV: 83.1 FL (ref 79.6–97.8)
MONOCYTES: 6 % (ref 4.0–12.0)
MPV: 11.6 FL (ref 9.4–12.3)
NEUTROPHILS: 53 % (ref 43–78)
PLATELET: 323 10*3/uL (ref 150–450)
RBC: 4.55 M/uL (ref 4.05–5.2)
RDW: 15.3 % — ABNORMAL HIGH (ref 11.9–14.6)
WBC: 9.1 10*3/uL (ref 4.3–11.1)

## 2021-02-21 MED ORDER — SODIUM CHLORIDE 0.9% BOLUS IV
0.9 % | Freq: Once | INTRAVENOUS | Status: AC
Start: 2021-02-21 — End: 2021-02-21
  Administered 2021-02-21: 21:00:00 via INTRAVENOUS

## 2021-02-21 MED ORDER — PROMETHAZINE 12.5 MG TAB
12.5 mg | ORAL_TABLET | Freq: Three times a day (TID) | ORAL | 0 refills | Status: AC | PRN
Start: 2021-02-21 — End: ?

## 2021-02-21 MED ORDER — DIPHENHYDRAMINE HCL 50 MG/ML IJ SOLN
50 mg/mL | INTRAMUSCULAR | Status: AC
Start: 2021-02-21 — End: 2021-02-21
  Administered 2021-02-21: 21:00:00 via INTRAVENOUS

## 2021-02-21 MED ORDER — KETOROLAC TROMETHAMINE 30 MG/ML INJECTION
30 mg/mL (1 mL) | INTRAMUSCULAR | Status: AC
Start: 2021-02-21 — End: 2021-02-21
  Administered 2021-02-21: 21:00:00 via INTRAVENOUS

## 2021-02-21 MED ORDER — ONDANSETRON (PF) 4 MG/2 ML INJECTION
4 mg/2 mL | INTRAMUSCULAR | Status: AC
Start: 2021-02-21 — End: 2021-02-21
  Administered 2021-02-21: 21:00:00 via INTRAVENOUS

## 2021-02-21 MED FILL — ONDANSETRON (PF) 4 MG/2 ML INJECTION: 4 mg/2 mL | INTRAMUSCULAR | Qty: 2

## 2021-02-21 MED FILL — DIPHENHYDRAMINE HCL 50 MG/ML IJ SOLN: 50 mg/mL | INTRAMUSCULAR | Qty: 1

## 2021-02-21 MED FILL — KETOROLAC TROMETHAMINE 30 MG/ML INJECTION: 30 mg/mL (1 mL) | INTRAMUSCULAR | Qty: 1

## 2021-02-21 NOTE — ED Notes (Signed)
I have reviewed discharge instructions with the patient and spouse.  The patient and spouse verbalized understanding.    Patient left ED via Discharge Method: ambulatory to Home with husband.    Opportunity for questions and clarification provided.       Patient given 1 scripts.         To continue your aftercare when you leave the hospital, you may receive an automated call from our care team to check in on how you are doing.  This is a free service and part of our promise to provide the best care and service to meet your aftercare needs." If you have questions, or wish to unsubscribe from this service please call [phone].  Thank you for Choosing our [facility] Emergency Department.

## 2021-02-21 NOTE — ED Provider Notes (Signed)
40 year old female with history of anxiety, depression, hypertension, chronic migraine headaches who presents with complaint of worsening headache over the past 2 to 3 months.  Patient states that she is seen via virtual physician who prescribed Fioricet previously.  Patient states that she gets around 2 to 3 hours of improved symptoms from her headache and then it returns.  Denies sudden onset of symptoms.  Denies slurred speech, facial droop, dizziness, difficulty ambulating, neck pain, back pain, chest pain, shortness of breath.  Patient states that 20 years ago she was worked up for pseudotumor cerebri and underwent lumbar puncture.  Patient states that all the lumbar puncture results were unremarkable and that she was not diagnosed with pseudotumor cerebri or idiopathic intracranial hypertension.  Patient states that she has been having photophobia and intermittent scotomas.  Patient states last menstrual cycle current.  Denies possibility being pregnant at this time.    The history is provided by the patient.   Headache   This is a chronic problem. The current episode started more than 1 week ago. The problem occurs constantly. The problem has not changed since onset.The headache is aggravated by nothing. The pain is located in the generalized region. The quality of the pain is described as throbbing. The pain is at a severity of 8/10. The pain is moderate. Associated symptoms include nausea. Pertinent negatives include no anorexia, no fever, no malaise/fatigue, no chest pressure, no near-syncope, no orthopnea, no palpitations, no syncope, no shortness of breath, no weakness, no tingling, no dizziness, no visual change and no vomiting. Treatments tried: Fioricet. The treatment provided no relief.        Past Medical History:   Diagnosis Date   ??? Allergies    ??? Anxiety    ??? Bilateral calf pain 09/01/2020   ??? Bladder problem    ??? Carpal tunnel syndrome    ??? Class 3 severe obesity without serious comorbidity with  body mass index (BMI) of 40.0 to 44.9 in adult [facility]) 09/01/2020   ??? Depression    ??? Encounter to establish care 09/01/2020   ??? Headache    ??? Hypertension    ??? Tinnitus of both ears 09/01/2020       Past Surgical History:   Procedure Laterality Date   ??? HX COLONOSCOPY     ??? HX LUMBAR FUSION  2002    patient denies this surgery on 11/09/20   ??? HX PELVIC LAPAROSCOPY  2002   ??? HX TONSILLECTOMY     ??? HX WISDOM TEETH EXTRACTION Bilateral          Family History:   Problem Relation Age of Onset   ??? Hypertension Mother    ??? Colon Polyps Mother    ??? Heart Disease Father    ??? Cancer Father        Social History     Socioeconomic History   ??? Marital status: MARRIED     Spouse name: Not on file   ??? Number of children: Not on file   ??? Years of education: Not on file   ??? Highest education level: Not on file   Occupational History   ??? Not on file   Tobacco Use   ??? Smoking status: Never Smoker   ??? Smokeless tobacco: Never Used   Vaping Use   ??? Vaping Use: Never used   Substance and Sexual Activity   ??? Alcohol use: Never   ??? Drug use: Never   ??? Sexual activity: Yes  Other Topics Concern   ??? Not on file   Social History Narrative   ??? Not on file     Social Determinants of Health     Financial Resource Strain:    ??? Difficulty of Paying Living Expenses: Not on file   Food Insecurity:    ??? Worried About Running Out of Food in the Last Year: Not on file   ??? Ran Out of Food in the Last Year: Not on file   Transportation Needs:    ??? Lack of Transportation (Medical): Not on file   ??? Lack of Transportation (Non-Medical): Not on file   Physical Activity:    ??? Days of Exercise per Week: Not on file   ??? Minutes of Exercise per Session: Not on file   Stress:    ??? Feeling of Stress : Not on file   Social Connections:    ??? Frequency of Communication with Friends and Family: Not on file   ??? Frequency of Social Gatherings with Friends and Family: Not on file   ??? Attends Religious Services: Not on file   ??? Active Member of Clubs or Organizations: Not  on file   ??? Attends Archivist Meetings: Not on file   ??? Marital Status: Not on file   Intimate Partner Violence:    ??? Fear of Current or Ex-Partner: Not on file   ??? Emotionally Abused: Not on file   ??? Physically Abused: Not on file   ??? Sexually Abused: Not on file   Housing Stability:    ??? Unable to Pay for Housing in the Last Year: Not on file   ??? Number of Places Lived in the Last Year: Not on file   ??? Unstable Housing in the Last Year: Not on file         ALLERGIES: Seasonale [levonorgestrel-ethinyl estrad] and Lortab [hydrocodone-acetaminophen]    Review of Systems   Constitutional: Negative for chills, diaphoresis, fatigue, fever and malaise/fatigue.   HENT: Negative for congestion, rhinorrhea, sore throat, trouble swallowing and voice change.    Eyes: Positive for photophobia. Negative for pain, discharge, redness, itching and visual disturbance.   Respiratory: Negative for cough and shortness of breath.    Cardiovascular: Negative for chest pain, palpitations, orthopnea, syncope and near-syncope.   Gastrointestinal: Positive for nausea. Negative for abdominal pain, anorexia, blood in stool, constipation, diarrhea, rectal pain and vomiting.   Genitourinary: Negative for dysuria, flank pain and pelvic pain.   Musculoskeletal: Negative for arthralgias, back pain, gait problem, myalgias, neck pain and neck stiffness.   Skin: Negative for color change and rash.   Neurological: Positive for headaches. Negative for dizziness, tingling, tremors, seizures, syncope, facial asymmetry, speech difficulty, weakness, light-headedness and numbness.   Hematological: Does not bruise/bleed easily.   Psychiatric/Behavioral: Negative for confusion.       Vitals:    02/21/21 1513 02/21/21 1600   BP: 123/83 (!) 141/78   Pulse: 77 81   Resp: 20 18   Temp: 98.1 ??F (36.7 ??C)    SpO2: 99% 100%   Weight: 111.1 kg (245 lb)    Height: 5' 4"  (1.626 m)             Physical Exam  Vitals and nursing note reviewed.   Constitutional:        Appearance: Normal appearance.   HENT:      Head: Normocephalic and atraumatic.      Right Ear: Tympanic membrane and ear canal normal.  Left Ear: Tympanic membrane and ear canal normal.      Nose: Nose normal.      Mouth/Throat:      Mouth: Mucous membranes are moist.   Eyes:      General:         Right eye: No discharge.         Left eye: No discharge.      Extraocular Movements: Extraocular movements intact.      Conjunctiva/sclera: Conjunctivae normal.      Pupils: Pupils are equal, round, and reactive to light.      Comments: No nystagmus.   Neck:      Comments: Full range of motion.  No nuchal rigidity.  Cardiovascular:      Rate and Rhythm: Normal rate.      Pulses: Normal pulses.      Heart sounds: Normal heart sounds.   Pulmonary:      Effort: Pulmonary effort is normal.      Breath sounds: Normal breath sounds.   Abdominal:      General: Bowel sounds are normal.      Palpations: Abdomen is soft.      Tenderness: There is no abdominal tenderness. There is no guarding or rebound.   Musculoskeletal:         General: No swelling or deformity. Normal range of motion.      Cervical back: Normal range of motion. No rigidity.   Skin:     General: Skin is warm.      Capillary Refill: Capillary refill takes less than 2 seconds.      Findings: No erythema or rash.   Neurological:      General: No focal deficit present.      Mental Status: She is alert and oriented to person, place, and time.      Cranial Nerves: No cranial nerve deficit.      Sensory: No sensory deficit.      Motor: No weakness.      Comments: Strength 5 out of 5 throughout.  No focal deficits.  Normal sensory exam.  No facial droop.  No dysarthria.  No aphasia.  No meningismus          MDM  Number of Diagnoses or Management Options  Nonintractable headache, unspecified chronicity pattern, unspecified headache type: new and requires workup  Diagnosis management comments: Vital signs stable.  Afebrile.  Labs unremarkable.  CT head with no  acute concerning findings.  Preg test negative.  Pt given IVF hydration, Toradol, Zofran, Benadryl.    Patient with complete resolution of symptoms.  Patient states that she feels much improved at this time is ready for discharge home.    Patient instructed on need for close follow-up with neurology, PCP.  Patient given return precautions.       Amount and/or Complexity of Data Reviewed  Clinical lab tests: ordered and reviewed  Tests in the radiology section of CPT??: ordered and reviewed  Tests in the medicine section of CPT??: ordered and reviewed  Review and summarize past medical records: yes  Independent visualization of images, tracings, or specimens: yes    Risk of Complications, Morbidity, and/or Mortality  Presenting problems: moderate  Diagnostic procedures: moderate  Management options: moderate  General comments: Results Include:    Recent Results (from the past 24 hour(s))  -CBC WITH AUTOMATED DIFF:   Collection Time: 02/21/21  3:22 PM       Result  Value             Ref Range           WBC                         9.1               4.3 - 11.1 K*       RBC                         4.55              4.05 - 5.2 M*       HGB                         12.1              11.7 - 15.4 *       HCT                         37.8              35.8 - 46.3 %       MCV                         83.1              79.6 - 97.8 *       MCH                         26.6              26.1 - 32.9 *       MCHC                        32.0              31.4 - 35.0 *       RDW                         15.3 (H)          11.9 - 14.6 %       PLATELET                    323               150 - 450 K/*       MPV                         11.6              9.4 - 12.3 FL       ABSOLUTE NRBC               0.00              0.0 - 0.2 K/*       DF                          AUTOMATED  NEUTROPHILS                 53                43 - 78 %           LYMPHOCYTES                 37                13 - 44 %            MONOCYTES                   6                 4.0 - 12.0 %        EOSINOPHILS                 3                 0.5 - 7.8 %         BASOPHILS                   1                 0.0 - 2.0 %         IMMATURE GRANULOCYTES       0                 0.0 - 5.0 %         ABS. NEUTROPHILS            4.8               1.7 - 8.2 K/*       ABS. LYMPHOCYTES            3.4               0.5 - 4.6 K/*       ABS. MONOCYTES              0.6               0.1 - 1.3 K/*       ABS. EOSINOPHILS            0.3               0.0 - 0.8 K/*       ABS. BASOPHILS              0.1               0.0 - 0.2 K/*       ABS. IMM. GRANS.            0.0               0.0 - 0.5 K/*  -METABOLIC PANEL, COMPREHENSIVE:   Collection Time: 02/21/21  3:22 PM       Result                      Value             Ref Range           Sodium                      141  136 - 145 mm*       Potassium                   3.7               3.5 - 5.1 mm*       Chloride                    106               98 - 107 mmo*       CO2                         29                21 - 32 mmol*       Anion gap                   6 (L)             7 - 16 mmol/L       Glucose                     100               65 - 100 mg/*       BUN                         7                 6 - 23 MG/DL        Creatinine                  0.80              0.6 - 1.0 MG*       GFR est AA                  >60               >60 ml/min/1*       GFR est non-AA              >60               >60 ml/min/1*       Calcium                     9.3               8.3 - 10.4 M*       Bilirubin, total            0.3               0.2 - 1.1 MG*       ALT (SGPT)                  32                12 - 65 U/L         AST (SGOT)                  15                15 - 37 U/L         Alk. phosphatase  80                50 - 136 U/L        Protein, total              7.3               6.3 - 8.2 g/*       Albumin                     3.4 (L)           3.5 - 5.0 g/*       Globulin                     3.9 (H)           2.3 - 3.5 g/*       A-G Ratio                   0.9 (L)           1.2 - 3.5      -HCG URINE, QL. - POC:   Collection Time: 02/21/21  4:43 PM       Result                      Value             Ref Range           Pregnancy test,urine (*     Negative          NEG                Patient Progress  Patient progress: stable    ED Course as of 02/21/21 1800   Mon Feb 21, 2021   1749 CT head FINDINGS: There is no acute intracranial hemorrhage, significant mass effect or  CT evidence of acute large artery territorial infarction. Please note that a  hyperacute infarct or small vessel infarct may not be apparent on initial CT  imaging.  ??  There is no hydrocephalus , intra-axial mass or abnormal extra-axial fluid  collection. There are no displaced skull fractures. The mastoid air cells and  paranasal sinuses are clear where imaged.  ??  IMPRESSION  No acute findings [DF]      ED Course User Index  [DF] Flowers, Josephus Harriger Rich Reining., MD       Procedures                 Deyvi Bonanno Steger Shirlean Schlein., MD; 02/21/2021 @4 :30 PM Voice dictation software was used during the making of this note.  This software is not perfect and grammatical and other typographical errors may be present.  This note has not been proofread for errors.  ===================================================================

## 2021-02-21 NOTE — Progress Notes (Signed)
Progress Notes by Caysen Whang, Martin[place] J, DO at 02/21/21 1320                Author: Griffon Herberg, [place] J, DO  Service: --  Author Type: Physician       Filed: 02/21/21 1404  Encounter Date: 02/21/2021  Status: Signed          Editor: Clydean Posas, Martinique J, DO (Physician)                    [place] Arlene Brickel, D.O.    [facility]   [place], [place]   Tel: [phone]        Office Visit: Follow Up                 Patient Name:  Karen Logan     DOB:   1981-05-19        MRN:    [id number]         Today's Date: 02/21/21 1:27 PM        Subjective        The patient is a 40 y.o. year old female with a pmh as listed below. She presents today via telehealth for follow up and acute complaint  of migraine headaches. She states she has been having terrible migraines that will not go away. She states she has history of spinal tap for headaches in the past. She states it hurts to open her eyes, bright lights, and also loud noises. She states she  is also having eye pain with flashes of light. She states the pain is bilateral. She states the pain is 10/10. She states that Fioricet is not really helping much but dulls the pain. She states the pain is present 24/7.         Review of Systems    Constitutional: Negative.     HENT: Negative.     Eyes: Positive for blurred vision.    Respiratory: Negative.     Cardiovascular: Negative.     Gastrointestinal: Negative.     Genitourinary: Negative.     Musculoskeletal: Negative.     Neurological: Positive for headaches.    Psychiatric/Behavioral: Positive for depression. The patient is nervous/anxious .           Past Medical History:      Diagnosis  Date      ?  Allergies        ?  Anxiety        ?  Bilateral calf pain  09/01/2020      ?  Bladder problem        ?  Carpal tunnel syndrome        ?  Class 3 severe obesity without serious comorbidity with body mass index (BMI) of 40.0 to 44.9 in adult [facility])  09/01/2020      ?  Depression        ?  Encounter to  establish care  09/01/2020      ?  Headache        ?  Hypertension        ?  Tinnitus of both ears  09/01/2020            Social History            Socioeconomic History      ?  Marital status:  MARRIED          Spouse name:  Not on file      ?  Number of children:  Not on file      ?  Years of education:  Not on file      ?  Highest education level:  Not on file      Occupational History      ?  Not on file      Tobacco Use      ?  Smoking status:  Never Smoker      ?  Smokeless tobacco:  Never Used      Vaping Use      ?  Vaping Use:  Never used      Substance and Sexual Activity      ?  Alcohol use:  Never      ?  Drug use:  Never      ?  Sexual activity:  Yes      Other Topics  Concern      ?  Not on file      Social History Narrative      ?  Not on file            Social Determinants of Health            Financial Resource Strain:       ?  Difficulty of Paying Living Expenses: Not on file      Food Insecurity:       ?  Worried About Running Out of Food in the Last Year: Not on file      ?  Ran Out of Food in the Last Year: Not on file      Transportation Needs:       ?  Lack of Transportation (Medical): Not on file      ?  Lack of Transportation (Non-Medical): Not on file      Physical Activity:       ?  Days of Exercise per Week: Not on file      ?  Minutes of Exercise per Session: Not on file      Stress:       ?  Feeling of Stress : Not on file      Social Connections:       ?  Frequency of Communication with Friends and Family: Not on file      ?  Frequency of Social Gatherings with Friends and Family: Not on file      ?  Attends Religious Services: Not on file      ?  Active Member of Clubs or Organizations: Not on file      ?  Attends Archivist Meetings: Not on file      ?  Marital Status: Not on file      Intimate Partner Violence:       ?  Fear of Current or Ex-Partner: Not on file      ?  Emotionally Abused: Not on file      ?  Physically Abused: Not on file      ?  Sexually Abused: Not on  file      Housing Stability:       ?  Unable to Pay for Housing in the Last Year: Not on file      ?  Number of Places Lived in the Last Year: Not on file      ?  Unstable Housing in the Last Year: Not on file  Current Outpatient Medications        Medication  Sig         ?  butalbital-acetaminophen-caffeine (FIORICET, ESGIC) 50-325-40 mg per tablet  Take 1 Tablet by mouth every six (6) hours as needed for Headache or Migraine for up to 7 days.     ?  valsartan (DIOVAN) 40 mg tablet  Take 1 Tablet by mouth daily for 90 days.     ?  melatonin 5 mg cap capsule  Take  by mouth nightly.     ?  buPROPion SR (WELLBUTRIN SR) 100 mg SR tablet  Take 1 Tablet by mouth daily.     ?  sertraline (ZOLOFT) 100 mg tablet  Take 2 Tablets by mouth daily.     ?  clonazePAM (KlonoPIN) 1 mg tablet  Take 1 Tablet by mouth nightly. Max Daily Amount: 1 mg.     ?  PNV EG.31/DVVOHYW fum/folic ac (PRENATAL PO)  Take  by mouth.     ?  ascorbic acid (VITAMIN C PO)  Take  by mouth.     ?  cholecalciferol (VITAMIN D3) (5000 Units/125 mcg) tab tablet  Take  by mouth daily.     ?  cyanocobalamin, vitamin B-12, (VITAMIN B-12 PO)  Take  by mouth.         ?  buPROPion XL (WELLBUTRIN XL) 150 mg tablet  Take 1 Tablet by mouth every morning.          No current facility-administered medications for this visit.           Objective        There were no vitals filed for this visit.       Physical Exam:   Constitutional: Appears well kempt. Alert/oriented x3. In no acute distress.   Head: Normocephalic No trauma. No deformity. No bruits.    Neck: Supple. ROM normal. No tenderness. No masses.   Cardiac: Heart with normal rate/rhythm. No murmurs. No gallops. Pulses normal.    Pulmonary: Lungs clear to auscultation bilaterally. In no respiratory distress. No wheezing. No rales. No rhonci.    Psychiatric: Normal thought content. Normal behavior. Normal judgment.         Recommendations        Assessment:     Patient Active Problem List         Diagnosis  Code         ?  Depression  F32.A     ?  Hypertension  I10     ?  Tinnitus of both ears  H93.13         ?  Class 3 severe obesity without serious comorbidity with body mass index (BMI) of 40.0 to 44.9 in adult (HCC)  E66.01, Z68.41            Plan:   -Patient has been experiencing bilateral severe headaches, eye pain and blurred vision, and jaw pain for 1-2 weeks. She initially thought it was a bad migraine as light and loud noises make her headaches worse. She had mild relief from fioricet. She has  a history of pseudotumor cerebri when she was 19-20 and had a spinal tap which cured her HA then.    -given the nature of her headache, I recommend the patient go to the ER for full work up. I have ordered CRP and ESR but would like the patient be fully ruled out for GCA and receive possible high dose steroids if indicated   -Patient  may also need spinal tap which cannot be done in office   -once patient is ruled out for GCA and pseudotumor cerebri and the need for high dose IV steroids and/or spinal we will follow up in office to treat her HA as migraines   -The patient expresses understanding of the plan as I've explained it to her and is in agreement  with the current plan.   -patient to go to the ER   -follow up in 1 week      This visit was completed using Telehealth with complete audio and video using the services provided by the [facility]   Patient was at home for the telehealth visit; I was in my office at the time of the visit  Patient Consent was obtained  prior to the telehealth visit   Total of 15 minutes video/audio services used         Time Spent in Patient Room: 15 minutes   Time Spent Pre- and Post Reviewing patient's chart: 15 minutes   Total Time: 30 minutes      Signed: [place] Lalo Tromp, D.O.   02/21/21   1:27 PM

## 2021-02-21 NOTE — ED Notes (Signed)
IV line placed and lab w/ temp labels sent.

## 2021-02-21 NOTE — ED Notes (Signed)
 Pt ambulatory to triage reports very bad HA worse than a migraine, head is pulsing, light and sound is painful, with blurry vision, floaters this started to happen about 5 months ago, today PCP advised to come to ED. Hx of these same symptoms had a spinal tap. Pt has been taken migraine medicine but has not helped pain 10/10.

## 2021-02-21 NOTE — ED Notes (Signed)
Pt to CT

## 2021-03-01 ENCOUNTER — Inpatient Hospital Stay: Admit: 2021-03-01 | Primary: Internal Medicine

## 2021-03-01 ENCOUNTER — Ambulatory Visit: Admit: 2021-03-01 | Discharge: 2021-03-01 | Attending: Adult Health | Primary: Internal Medicine

## 2021-03-01 ENCOUNTER — Ambulatory Visit: Attending: Adult Health | Primary: Internal Medicine

## 2021-03-01 DIAGNOSIS — R519 Headache, unspecified: Secondary | ICD-10-CM

## 2021-03-01 LAB — SEDIMENTATION RATE, AUTOMATED: Sed Rate: 29 mm/hr — ABNORMAL HIGH (ref 0–20)

## 2021-03-01 LAB — C-REACTIVE PROTEIN: CRP: <0.3 mg/dL (ref 0.0–0.9)

## 2021-03-01 LAB — SED RATE, AUTOMATED: Sed rate, automated: 29 mm/hr — ABNORMAL HIGH (ref 0–20)

## 2021-03-01 LAB — C REACTIVE PROTEIN, QT: C-Reactive protein: <0.3 mg/dL (ref 0.0–0.9)

## 2021-03-01 MED ORDER — NURTEC ODT 75 MG DISINTEGRATING TABLET
75 mg | ORAL_TABLET | Freq: Once | ORAL | 5 refills | Status: AC | PRN
Start: 2021-03-01 — End: 2021-04-27

## 2021-03-01 MED ORDER — TOPIRAMATE 25 MG TAB
25 mg | ORAL_TABLET | ORAL | 0 refills | Status: DC
Start: 2021-03-01 — End: 2021-03-16

## 2021-03-01 NOTE — Progress Notes (Signed)
[facility]  [address]  [address]  [place] [address]      Chief Complaint   Patient presents with   ??? New Patient     ER FU MIGRAINES       Chapel Silverthorn is a 40 y.o. female who presents hospital follow up from ER for headache.       Headaches are located occipital region on the left and radiate behind left eye and diffusely generalizes.   Stated she had similar episode when she was 1, stated an MRI was completed and then she had a lumbar puncture.  Headaches resolved after LP.  She does endorse increasing frequency of her headaches, stated she has had 3 severe headaches within the past 30 days..  Duration: Lasting greater than 24 hours  Severity is 10/10. Quality of headaches are described as throbbing, feels like her head is in a vice.  Associated symptoms: Photophobia, phonophobia, nausea, scintillating schemata.  Describes aura as gold specks that rain down, previously were blue specks.   Denies worsening with positional changes, thunderclap, or worst headache of her life. The headaches are exacerbated by noise and light.   Factors that relieve the headaches are lying down in a dark room, covering her eyes with a cool compress.       She does endorse snoring at night, stated her husband said this has been getting worse.    Endorses drinking 4 cups of coffee a day, she also drinks 1 to 2 gallons of water daily.    Family Members with headache history: No family history of headaches, migraines or other neurological disorders    Current Medications used for HA treatment: Phenergan, Benadryl, Tylenol    Medications tried in the past: Over-the-counter Tylenol, ibuprofen, Fioricet    Associated medical problems: HTN, anxiety, obesity     Previous Imaging: CT of the head negative for acute intracranial abnormalities.  She has had previously had an MRI in the past, however unable to review imaging.    Previous Testing: Previous LP, unable to review results.       Past Medical History:    Diagnosis Date   ??? Allergies    ??? Anxiety    ??? Bilateral calf pain 09/01/2020   ??? Bladder problem    ??? Carpal tunnel syndrome    ??? Class 3 severe obesity without serious comorbidity with body mass index (BMI) of 40.0 to 44.9 in adult [facility]) 09/01/2020   ??? Depression    ??? Encounter to establish care 09/01/2020   ??? Headache    ??? Hypertension    ??? Tinnitus of both ears 09/01/2020       Past Surgical History:   Procedure Laterality Date   ??? HX COLONOSCOPY     ??? HX LUMBAR FUSION  2002    patient denies this surgery on 11/09/20   ??? HX PELVIC LAPAROSCOPY  2002   ??? HX TONSILLECTOMY     ??? HX WISDOM TEETH EXTRACTION Bilateral        Family History   Problem Relation Age of Onset   ??? Hypertension Mother    ??? Colon Polyps Mother    ??? Heart Disease Father    ??? Cancer Father        Social History     Socioeconomic History   ??? Marital status: MARRIED   Tobacco Use   ??? Smoking status: Never Smoker   ??? Smokeless tobacco: Never Used   Vaping  Use   ??? Vaping Use: Never used   Substance and Sexual Activity   ??? Alcohol use: Never   ??? Drug use: Never   ??? Sexual activity: Yes         Current Outpatient Medications:   ???  promethazine (PHENERGAN) 12.5 mg tablet, Take 1 Tablet by mouth every eight (8) hours as needed for Nausea (vomiting) for up to 10 doses., Disp: 10 Tablet, Rfl: 0  ???  valsartan (DIOVAN) 40 mg tablet, Take 1 Tablet by mouth daily for 90 days., Disp: 30 Tablet, Rfl: 2  ???  melatonin 5 mg cap capsule, Take  by mouth nightly., Disp: , Rfl:   ???  buPROPion SR (WELLBUTRIN SR) 100 mg SR tablet, Take 1 Tablet by mouth daily., Disp: 30 Tablet, Rfl: 3  ???  sertraline (ZOLOFT) 100 mg tablet, Take 2 Tablets by mouth daily., Disp: 60 Tablet, Rfl: 3  ???  clonazePAM (KlonoPIN) 1 mg tablet, Take 1 Tablet by mouth nightly. Max Daily Amount: 1 mg., Disp: 30 Tablet, Rfl: 3  ???  PNV UJ.81/XBJYNWG fum/folic ac (PRENATAL PO), Take  by mouth., Disp: , Rfl:   ???  ascorbic acid (VITAMIN C PO), Take  by mouth., Disp: , Rfl:   ???  cholecalciferol (VITAMIN D3)  (5000 Units/125 mcg) tab tablet, Take  by mouth daily., Disp: , Rfl:   ???  cyanocobalamin, vitamin B-12, (VITAMIN B-12 PO), Take  by mouth., Disp: , Rfl:     Allergies   Allergen Reactions   ??? Seasonale [Levonorgestrel-Ethinyl Estrad] Itching   ??? Lortab [Hydrocodone-Acetaminophen] Nausea Only and Swelling       REVIEW OF SYSTEMS:  CONSTITUTIONAL: No weight loss, fever, chills, weakness or fatigue.  HEENT: Eyes: Blurred vision no visual loss, double vision or yellow sclerae. Ears, Nose, Throat: No hearing loss, sneezing, congestion, runny nose or sore throat.  SKIN: No rash or itching.  CARDIOVASCULAR: No chest pain, chest pressure or chest discomfort. No palpitations or edema.  RESPIRATORY: No shortness of breath, cough or sputum.  GASTROINTESTINAL: Nausea no anorexia, vomiting or diarrhea. No abdominal pain or blood.  GENITOURINARY: no burning with urination.   NEUROLOGICAL: Headache no  dizziness, syncope, paralysis, ataxia, numbness or tingling in the extremities. No change in bowel or bladder control.  MUSCULOSKELETAL: No muscle, back pain, joint pain or stiffness.  HEMATOLOGIC: No anemia, bleeding or bruising.  LYMPHATICS: No enlarged nodes. No history of splenectomy.  PSYCHIATRIC: history of depression or anxiety.  ENDOCRINOLOGIC: No reports of sweating, cold or heat intolerance. No polyuria or polydipsia.  ALLERGIES: No history of asthma, hives, eczema or rhinitis.    Physical Examination  Visit Vitals  BP (!) 143/90   Pulse 75   Wt 250 lb 6.4 oz (113.6 kg)   SpO2 97%   BMI 42.98 kg/m??       General - Well developed, obese, in no apparent distress. Pleasant and conversent.   HEENT - Normocephalic, atraumatic. No papilledema.  normal conjunctiva, tympanic membranes, and oropharynx are clear.   Neck - Supple without masses, no bruits   Cardiovascular - Regular rate and rhythm. Normal S1, S2 without murmurs, rubs, or gallops.  Lungs - Clear to auscultation.  Abdomen - Soft, nontender with normal bowel sounds.    Extremities - Peripheral pulses intact. No edema and no rashes.     Neurological examination - Comprehension, attention , memory and reasoning are intact. Language and speech are normal. On cranial nerve examination pupils are equal round and reactive  to light. Fundoscopic examination is normal. Visual acuity is adequate. Visual fields are full to finger confrontation. Extraocular motility is normal. Face is symmetric and sensation is intact to light touch. Hearing is intact to finger rustle bilaterally. Motor examination - There is normal muscle tone and bulk. Power is full throughout. Muscle stretch reflexes are normoactive and there are no pathological reflexes present. Sensation is intact to light touch, pinprick, vibration and proprioception in all extremities. Cerebellar examination is normal. Gait and stance are normal.     Most recent CT  - I personally reviewed this image   Results from Hospital Encounter encounter on 02/21/21    CT HEAD WO CONT    Narrative  HEAD CT WITHOUT CONTRAST  02/21/2021    HISTORY:   HA  Pt ambulatory to triage reports very bad HA worse than a  migraine, head is pulsing, light and sound is painful, with blurry vision,  "floaters" this started to happen about 5 months ago, today PCP advised to come  to ED. Hx of these same symptoms had a -spinal tap. Pt has been taken migraine  medicine but has not helped pain 10/10.    TECHNIQUE: Noncontrast axial images were obtained through the brain.  All CT  scans at this facility used dose modulation, interactive reconstruction and/or  weight based dosing when appropriate to reduce radiation dose to as low as  reasonably achievable.    COMPARISON: None    FINDINGS: There is no acute intracranial hemorrhage, significant mass effect or  CT evidence of acute large artery territorial infarction. Please note that a  hyperacute infarct or small vessel infarct may not be apparent on initial CT  imaging.    There is no hydrocephalus , intra-axial  mass or abnormal extra-axial fluid  collection. There are no displaced skull fractures. The mastoid air cells and  paranasal sinuses are clear where imaged.    Impression  No acute findings      Most recent MRI - I personally reviewed this image   No results found for this or any previous visit.      Diagnoses and all orders for this visit:    1. New onset headache   Differential includes: pseudotumor cerebri v. Migraine w/ aura   Will obtain ESR and CRP to rule out GCA   Will obtain MRI of brain.   May need high volume LP.  Start on toprimate 25 mg nightly for 7 days, then increase to 50 mg nightly. Medication and side effects discussed with patient. No prior hx of kidney stones and she is currently not trying to have children. Discussed that she will need to notify office, when she plans on having children as this medication will need to be changed.   Sample of nurtec ODT 75 mg administered in office, headache reduced from 10/10 to 7/10. Provided additional samples and patient assistance card. Will continue Nurtec ODT 75 mg daily for migraine abortive therapy. Medication and side effects discussed with patient.   -     MRI BRAIN WO CONT; Future  -     SED RATE (ESR)  -     C REACTIVE PROTEIN, QT; Future  -     topiramate (TOPAMAX) 25 mg tablet; Take 1 Tablet by mouth nightly for 7 days, THEN 2 Tablets nightly for 23 days. Indications: migraine prevention  -     REFERRAL TO SLEEP STUDIES    2. Primary hypertension    3. Class 3 severe obesity  due to excess calories without serious comorbidity with body mass index (BMI) of 40.0 to 44.9 in adult [facility])    Follow up in 4-6 weeks or sooner if needed    I spent greater than 50% of the 60 total minutes of today's visit in coordination of care and patient/family education and counseling regarding the above patient concerns, reviewing the patient's medical record, my assessment and recommendations.      Rolm Bookbinder, APRN

## 2021-03-02 NOTE — Telephone Encounter (Signed)
Pt called and said that the Nurtec is going to cost her 2000.00 and she has no insurance. She took 2 tablets yesterday. She took 1 and then she had to take another 1 along with the Benadryl last night. We have samples here for you when you need them. The pt is crying because she does not know what to do when it wears off and she can't afford it. She took 1 while she was in the office yesterday but then she had to take another 1. Tylenol or Aleve for breakthrough HA's. The pt is going to come and get more samples tomorrow. I have the pt assistance forms for her to complete.

## 2021-03-08 ENCOUNTER — Encounter: Attending: Psychiatry | Primary: Internal Medicine

## 2021-03-10 ENCOUNTER — Encounter

## 2021-03-10 NOTE — Telephone Encounter (Signed)
PT called about her MRI. Not able to get scheduled . The order is in but it says not released. Can this be released if that is what is holding the pt from getting scheduled

## 2021-03-14 ENCOUNTER — Encounter

## 2021-03-14 NOTE — Telephone Encounter (Signed)
Pt needs MRI of head wo, Shanda Bumps put in referral but not release, can you please see if you are able to generate. Thank you.

## 2021-03-15 ENCOUNTER — Ambulatory Visit: Admit: 2021-03-15 | Discharge: 2021-03-15 | Attending: Internal Medicine | Primary: Internal Medicine

## 2021-03-15 ENCOUNTER — Encounter

## 2021-03-15 ENCOUNTER — Ambulatory Visit: Attending: Internal Medicine | Primary: Internal Medicine

## 2021-03-15 DIAGNOSIS — R930 Abnormal findings on diagnostic imaging of skull and head, not elsewhere classified: Secondary | ICD-10-CM

## 2021-03-15 DIAGNOSIS — Z6841 Body Mass Index (BMI) 40.0 and over, adult: Secondary | ICD-10-CM

## 2021-03-15 MED ORDER — HYDROCHLOROTHIAZIDE 25 MG TAB
25 mg | ORAL_TABLET | Freq: Every day | ORAL | 0 refills | Status: DC
Start: 2021-03-15 — End: 2021-04-13

## 2021-03-15 NOTE — Progress Notes (Signed)
Notified patient per Dr.Jordan Grnak that her liver tests were perfect.     Patient verbally expressed understanding and denies any questions, comments or concerns at this time.

## 2021-03-15 NOTE — Progress Notes (Signed)
Progress Notes by Harrell Niehoff, [place] J, DO at 03/15/21 1340                Author: Jevante Hollibaugh, [place] J, DO  Service: --  Author Type: Physician       Filed: 03/15/21 1411  Encounter Date: 03/15/2021  Status: Signed          Editor: Steele Ledonne, [place] J, DO (Physician)                    [place] Trygg Mantz, D.O.    [facility]   [address]   [place], [place] [address]   Tel: [phone]        Office Visit: Follow Up                 Patient Name:  [place]     DOB:   1981/11/20        MRN:    [id number]         Today's Date: 03/15/21 1:40 PM        Subjective        The patient is a 40 y.o. year old female with a pmh as listed below. She presents today for follow up of migraines, anxiety, obesity,  and depression. Since her last visit see has seen neurology for her headaches and diagnosed with migraine without aura. She was started on Topamax and a trial of Nurtec. She also has an MRI of the brain today. She states the new medications have not helped  her at all. She states Ibuprofen and Benadryl will help somewhat.       She states her allergies have been really bad as well.           Review of Systems    Constitutional: Negative.     HENT: Positive for ear discharge (clear white) .     Eyes: Negative.     Respiratory: Positive for cough, sputum production  and wheezing. Negative for hemoptysis and shortness of breath.     Cardiovascular: Negative.     Gastrointestinal: Negative.     Genitourinary: Negative.     Musculoskeletal: Negative.     Neurological: Positive for headaches. Negative for dizziness, tingling and sensory change.    Psychiatric/Behavioral: Positive for depression. The patient is nervous/anxious .        Past Medical History:      Diagnosis  Date      ?  Allergies        ?  Anxiety        ?  Bilateral calf pain  09/01/2020      ?  Bladder problem        ?  Carpal tunnel syndrome        ?  Class 3 severe obesity without serious comorbidity with body mass index (BMI) of 40.0 to 44.9 in  adult [facility])  09/01/2020      ?  Depression        ?  Encounter to establish care  09/01/2020      ?  Headache        ?  Hypertension        ?  Intractable migraine without aura and without status migrainosus  03/15/2021      ?  Tinnitus of both ears  09/01/2020            Social History            Socioeconomic History      ?  Marital status:  MARRIED          Spouse name:  Not on file      ?  Number of children:  Not on file      ?  Years of education:  Not on file      ?  Highest education level:  Not on file      Occupational History      ?  Not on file      Tobacco Use      ?  Smoking status:  Never Smoker      ?  Smokeless tobacco:  Never Used      Vaping Use      ?  Vaping Use:  Never used      Substance and Sexual Activity      ?  Alcohol use:  Never      ?  Drug use:  Never      ?  Sexual activity:  Yes      Other Topics  Concern      ?  Not on file      Social History Narrative      ?  Not on file            Social Determinants of Health            Financial Resource Strain:       ?  Difficulty of Paying Living Expenses: Not on file      Food Insecurity:       ?  Worried About Running Out of Food in the Last Year: Not on file      ?  Ran Out of Food in the Last Year: Not on file      Transportation Needs:       ?  Lack of Transportation (Medical): Not on file      ?  Lack of Transportation (Non-Medical): Not on file      Physical Activity:       ?  Days of Exercise per Week: Not on file      ?  Minutes of Exercise per Session: Not on file      Stress:       ?  Feeling of Stress : Not on file      Social Connections:       ?  Frequency of Communication with Friends and Family: Not on file      ?  Frequency of Social Gatherings with Friends and Family: Not on file      ?  Attends Religious Services: Not on file      ?  Active Member of Clubs or Organizations: Not on file      ?  Attends Banker Meetings: Not on file      ?  Marital Status: Not on file      Intimate Partner Violence:       ?  Fear of  Current or Ex-Partner: Not on file      ?  Emotionally Abused: Not on file      ?  Physically Abused: Not on file      ?  Sexually Abused: Not on file      Housing Stability:       ?  Unable to Pay for Housing in the Last Year: Not on file      ?  Number of Places Lived in the Last Year: Not on file      ?  Unstable Housing in the Last Year: Not on file                    Current Outpatient Medications        Medication  Sig         ?  topiramate (TOPAMAX) 25 mg tablet  Take 1 Tablet by mouth nightly for 7 days, THEN 2 Tablets nightly for 23 days. Indications: migraine prevention     ?  rimegepant (Nurtec ODT) 75 mg disintegrating tablet  Take 1 Tablet by mouth once as needed for Migraine for up to 1 dose. Indications: a migraine headache     ?  promethazine (PHENERGAN) 12.5 mg tablet  Take 1 Tablet by mouth every eight (8) hours as needed for Nausea (vomiting) for up to 10 doses.     ?  valsartan (DIOVAN) 40 mg tablet  Take 1 Tablet by mouth daily for 90 days.     ?  melatonin 5 mg cap capsule  Take  by mouth nightly.     ?  buPROPion SR (WELLBUTRIN SR) 100 mg SR tablet  Take 1 Tablet by mouth daily.     ?  sertraline (ZOLOFT) 100 mg tablet  Take 2 Tablets by mouth daily.     ?  clonazePAM (KlonoPIN) 1 mg tablet  Take 1 Tablet by mouth nightly. Max Daily Amount: 1 mg.     ?  PNV no.95/ferrous fum/folic ac (PRENATAL PO)  Take  by mouth.     ?  ascorbic acid (VITAMIN C PO)  Take  by mouth.     ?  cholecalciferol (VITAMIN D3) (5000 Units/125 mcg) tab tablet  Take  by mouth daily.         ?  cyanocobalamin, vitamin B-12, (VITAMIN B-12 PO)  Take  by mouth.          No current facility-administered medications for this visit.              Objective          Vitals:           03/15/21 1342  03/15/21 1348         BP:  (!) 164/80  (!) 142/67     BP 1 Location:  Left upper arm  Left upper arm     BP Patient Position:  Sitting  Sitting     BP Cuff Size:  Adult long  Adult long     Pulse:  88  90     Temp:  98.1 ??F (36.7 ??C)        TempSrc:  Temporal       Resp:  14       Height:  5\' 4"  (1.626 m)       Weight:  248 lb (112.5 kg)   Comment: with shoes           SpO2:  99%   Comment: room air           Physical Exam:   Constitutional: Appears well kempt. Alert/oriented x3. In no acute distress.   Head: Normocephalic No trauma. No deformity. No bruits.    Neck: Supple. ROM normal. No tenderness. No masses.   Cardiac: Heart with normal rate/rhythm. No murmurs. No gallops. Pulses normal.    Pulmonary: Lungs clear to auscultation bilaterally. In no respiratory distress. No wheezing. No rales. No rhonci.    Psychiatric: Normal thought content. Normal behavior. Normal judgment.  Recommendations        Assessment:     Patient Active Problem List        Diagnosis  Code         ?  Depression  F32.A     ?  Hypertension  I10     ?  Tinnitus of both ears  H93.13     ?  Class 3 severe obesity without serious comorbidity with body mass index (BMI) of 40.0 to 44.9 in adult (HCC)  E66.01, Z68.41         ?  Intractable migraine without aura and without status migrainosus  G43.019         Plan:   -MRI tonight   -follow up with Neurology   -start hydrochlorothiazide 12.5 mg; instructed the patient to monitor her water intake and make sure she is not getting dehydrated   -Check Liver Panel   -will look into Rizatriptan for her   -The patient expresses understanding of the plan as I've explained it to her and is in agreement  with the current plan.   -Follow up in 2 weeks      Time Spent in Patient Room: 15 minutes   Time Spent Pre- and Post Reviewing patient's chart: 15 minutes   Total Time: 30 minutes      Signed: [place] Clance Baquero, D.O.   03/15/21   1:40 PM

## 2021-03-16 ENCOUNTER — Encounter

## 2021-03-16 ENCOUNTER — Inpatient Hospital Stay: Admit: 2021-03-16 | Attending: Family | Primary: Internal Medicine

## 2021-03-16 ENCOUNTER — Encounter: Attending: Internal Medicine | Primary: Internal Medicine

## 2021-03-16 LAB — HEPATIC FUNCTION PANEL
ALT (SGPT): 10 IU/L (ref 0–32)
ALT: 10 IU/L (ref 0–32)
AST (SGOT): 14 IU/L (ref 0–40)
AST: 14 IU/L (ref 0–40)
Albumin: 4.4 g/dL (ref 3.8–4.8)
Albumin: 4.4 g/dL (ref 3.8–4.8)
Alk. phosphatase: 84 IU/L (ref 44–121)
Alkaline Phosphatase: 84 IU/L (ref 44–121)
Bilirubin, Direct: <0.1 mg/dL (ref 0.00–0.40)
Bilirubin, direct: <0.1 mg/dL (ref 0.00–0.40)
Bilirubin, total: <0.2 mg/dL (ref 0.0–1.2)
Protein, total: 6.9 g/dL (ref 6.0–8.5)
Total Bilirubin: <0.2 mg/dL (ref 0.0–1.2)
Total Protein: 6.9 g/dL (ref 6.0–8.5)

## 2021-03-16 MED ORDER — ACETAZOLAMIDE SR 500 MG CAP
500 mg | ORAL_CAPSULE | Freq: Two times a day (BID) | ORAL | 5 refills | Status: AC
Start: 2021-03-16 — End: ?

## 2021-03-16 NOTE — Progress Notes (Signed)
MRI demonstrated pseudotumor cerebri. Results discussed with patient. She is to discontinue topirimate. Start Diamox 500 mg twice daily. Medication and side effects discussed. She is to call office if she develops new or worsening symptoms. Diet and exercise discussed.

## 2021-03-29 ENCOUNTER — Ambulatory Visit: Admit: 2021-03-29 | Discharge: 2021-03-29 | Attending: Internal Medicine | Primary: Internal Medicine

## 2021-03-29 ENCOUNTER — Ambulatory Visit: Attending: Internal Medicine | Primary: Internal Medicine

## 2021-03-29 DIAGNOSIS — F3341 Major depressive disorder, recurrent, in partial remission: Secondary | ICD-10-CM

## 2021-03-29 DIAGNOSIS — E663 Overweight: Secondary | ICD-10-CM | POA: Diagnosis not present

## 2021-03-29 DIAGNOSIS — F411 Generalized anxiety disorder: Secondary | ICD-10-CM | POA: Diagnosis not present

## 2021-03-29 NOTE — Progress Notes (Signed)
Progress Notes by Prabhnoor Ellenberger, [place] J, DO at 03/29/21 1400                Author: Sincere Liuzzi, [place] J, DO  Service: --  Author Type: Physician       Filed: 03/29/21 1420  Encounter Date: 03/29/2021  Status: Signed          Editor: Yevette Knust, [place] J, DO (Physician)                    [place] Kaydence Menard, D.O.    [facility]   [address]   [place], [place] [address]   Tel: [phone]        Office Visit: Follow Up           Patient Name:  Karen Logan     DOB:   05-Jan-1981        MRN:    [id number]         Today's Date: 03/29/21 1:52 PM        Subjective        The patient is a 40 y.o. year old female with a pmh as listed below. She presents today for follow up of migraines/headache, hypertension,  and obesity.  She states that after her MRI which showed suspicious findings of pseudotumor cerebri, however neurology NP discontinued her Topamax and started her on Diamox.  She states that since starting Diamox she has felt significantly better and  has not been taking the Nurtec.  She states she still can feel some fullness in her neck but that the pain is much better.      Review of Systems    Constitutional: Negative.     HENT: Negative.     Eyes: Negative.     Respiratory: Negative.     Cardiovascular: Negative.     Gastrointestinal: Negative.     Genitourinary: Negative.     Musculoskeletal: Negative.     Neurological: Positive for headaches. Negative for tingling and sensory change.    Psychiatric/Behavioral: Positive for depression. The patient is nervous/anxious .        Past Medical History:      Diagnosis  Date      ?  Allergies        ?  Anxiety        ?  Bilateral calf pain  09/01/2020      ?  Bladder problem        ?  Carpal tunnel syndrome        ?  Class 3 severe obesity without serious comorbidity with body mass index (BMI) of 40.0 to 44.9 in adult [facility])  09/01/2020      ?  Depression        ?  Encounter to establish care  09/01/2020      ?  Headache        ?  Hypertension        ?   Intractable migraine without aura and without status migrainosus  03/15/2021      ?  Tinnitus of both ears  09/01/2020            Social History            Socioeconomic History      ?  Marital status:  MARRIED          Spouse name:  Not on file      ?  Number of children:  Not on file      ?  Years of education:  Not on file      ?  Highest education level:  Not on file      Occupational History      ?  Not on file      Tobacco Use      ?  Smoking status:  Never Smoker      ?  Smokeless tobacco:  Never Used      Vaping Use      ?  Vaping Use:  Never used      Substance and Sexual Activity      ?  Alcohol use:  Never      ?  Drug use:  Never      ?  Sexual activity:  Yes      Other Topics  Concern      ?  Not on file      Social History Narrative      ?  Not on file            Social Determinants of Health            Financial Resource Strain:       ?  Difficulty of Paying Living Expenses: Not on file      Food Insecurity:       ?  Worried About Running Out of Food in the Last Year: Not on file      ?  Ran Out of Food in the Last Year: Not on file      Transportation Needs:       ?  Lack of Transportation (Medical): Not on file      ?  Lack of Transportation (Non-Medical): Not on file      Physical Activity:       ?  Days of Exercise per Week: Not on file      ?  Minutes of Exercise per Session: Not on file      Stress:       ?  Feeling of Stress : Not on file      Social Connections:       ?  Frequency of Communication with Friends and Family: Not on file      ?  Frequency of Social Gatherings with Friends and Family: Not on file      ?  Attends Religious Services: Not on file      ?  Active Member of Clubs or Organizations: Not on file      ?  Attends Banker Meetings: Not on file      ?  Marital Status: Not on file      Intimate Partner Violence:       ?  Fear of Current or Ex-Partner: Not on file      ?  Emotionally Abused: Not on file      ?  Physically Abused: Not on file      ?  Sexually Abused: Not  on file      Housing Stability:       ?  Unable to Pay for Housing in the Last Year: Not on file      ?  Number of Places Lived in the Last Year: Not on file      ?  Unstable Housing in the Last Year: Not on file                    Current Outpatient Medications  Medication  Sig         ?  acetaZOLAMIDE SR (DIAMOX) 500 mg capsule  Take 1 Capsule by mouth two (2) times a day. Indications: pseudotumor cerebri, a condition with high fluid pressure in the brain     ?  hydroCHLOROthiazide (HYDRODIURIL) 25 mg tablet  Take 0.5 Tablets by mouth daily for 30 days.     ?  promethazine (PHENERGAN) 12.5 mg tablet  Take 1 Tablet by mouth every eight (8) hours as needed for Nausea (vomiting) for up to 10 doses.     ?  valsartan (DIOVAN) 40 mg tablet  Take 1 Tablet by mouth daily for 90 days.     ?  melatonin 5 mg cap capsule  Take  by mouth nightly.     ?  buPROPion SR (WELLBUTRIN SR) 100 mg SR tablet  Take 1 Tablet by mouth daily.     ?  sertraline (ZOLOFT) 100 mg tablet  Take 2 Tablets by mouth daily.     ?  clonazePAM (KlonoPIN) 1 mg tablet  Take 1 Tablet by mouth nightly. Max Daily Amount: 1 mg.     ?  PNV no.95/ferrous fum/folic ac (PRENATAL PO)  Take  by mouth.     ?  ascorbic acid (VITAMIN C PO)  Take  by mouth.     ?  cholecalciferol (VITAMIN D3) (5000 Units/125 mcg) tab tablet  Take  by mouth daily.     ?  cyanocobalamin, vitamin B-12, (VITAMIN B-12 PO)  Take  by mouth.         ?  rimegepant (Nurtec ODT) 75 mg disintegrating tablet  Take 1 Tablet by mouth once as needed for Migraine for up to 1 dose. Indications: a migraine headache          No current facility-administered medications for this visit.           Objective          Vitals:          03/29/21 1354        BP:  128/65     BP 1 Location:  Left upper arm     BP Patient Position:  Sitting     BP Cuff Size:  Adult long     Pulse:  88     Temp:  98.3 ??F (36.8 ??C)     TempSrc:  Temporal     Resp:  14     Height:  5\' 4"  (1.626 m)     Weight:  245 lb (111.1  kg)   Comment: with shoes        SpO2:  98%   Comment: Room air         Physical Exam:   Constitutional: Appears well kempt. Alert/oriented x3. In no acute distress.   Head: Normocephalic No trauma. No deformity. No bruits.    Neck: Supple. ROM normal. No tenderness. No masses.   Cardiac: Heart with normal rate/rhythm. No murmurs. No gallops. Pulses normal.    Pulmonary: Lungs clear to auscultation bilaterally. In no respiratory distress. No wheezing. No rales. No rhonci.    Psychiatric: Normal thought content. Normal behavior. Normal judgment.         Recommendations        Assessment:     Patient Active Problem List        Diagnosis  Code         ?  Depression  F32.A     ?  Hypertension  I10     ?  Tinnitus of both ears  H93.13     ?  Class 3 severe obesity without serious comorbidity with body mass index (BMI) of 40.0 to 44.9 in adult (HCC)  E66.01, Z68.41         ?  Intractable migraine without aura and without status migrainosus  G43.019         Plan:   -The patient is doing well currently on Diamox.  I spoke with the neurology NP Shanda Bumps on the phone prior to the patient's visit about medications including rizatriptan and Nurtec.  It was discussed that combining triptan with an SSRI has low risk for  serotonin syndrome but that since the Diamox seems to be helping the patient significantly that we will not change any medications at this point.  The patient has a follow-up appointment with neurology on May 3 and has follow-up appointments with therapy  and psychiatry on May 16 and May 18 respectively and will be seeing the neurology physician on June 1.  At this point the patient should continue current treatment plan and if anything were to change she is to call myself or neurology to be seen sooner  for an appointment.  If she has any severe symptoms changes or new symptoms develop she is to go to the ER.   -The patient expresses understanding of the plan as I've explained it to her and is in agreement  with  the current plan.   -follow up in July       Time Spent in Patient Room: 15 minutes   Time Spent Pre- and Post Reviewing patient's chart: 15 minutes   Total Time: 30 minutes      Signed: [place] Denys Labree, D.O.   03/29/21   1:52 PM

## 2021-03-30 NOTE — Telephone Encounter (Signed)
Looks like MRI was done on 03/15/21.

## 2021-04-12 ENCOUNTER — Encounter: Attending: Adult Health | Primary: Internal Medicine

## 2021-04-13 ENCOUNTER — Encounter

## 2021-04-13 MED ORDER — VALSARTAN 40 MG TAB
40 mg | ORAL_TABLET | Freq: Every day | ORAL | 1 refills | Status: AC
Start: 2021-04-13 — End: 2021-07-12

## 2021-04-13 MED ORDER — HYDROCHLOROTHIAZIDE 25 MG TAB
25 mg | ORAL_TABLET | Freq: Every day | ORAL | 2 refills | Status: AC
Start: 2021-04-13 — End: 2021-05-13

## 2021-04-13 NOTE — Telephone Encounter (Signed)
Telephone Encounter by Ramond Dial B at 04/13/21 1610                Author: Ramond Dial B  Service: --  Author Type: Medical Assistant       Filed: 04/13/21 1610  Encounter Date: 04/13/2021  Status: Signed          Editor: Lequita Asal (Medical Assistant)          From: Otho Ket: Karen Logan, DOSent: 04/13/2021  3:55 PM EDTSubject: BP MedsI need refills on my Hydrochlorotgiazide BP and Valsartin BP, please.  Can I have a couple months refills on them as well please? My pharmacy only texts me about the ones I have refills on. Thank you and have a great afternoon! Karen Logan [phone]

## 2021-04-22 NOTE — Telephone Encounter (Signed)
Called patient. There should still be refills at the pharmacy for all medications. Patient will also need to discuss with the pharmacy about the dates when prescriptions can be picked up as Klonopin can only be filled every 30 days as it is controlled.

## 2021-04-25 ENCOUNTER — Ambulatory Visit: Admit: 2021-04-25 | Discharge: 2021-04-25 | Attending: Clinical | Primary: Internal Medicine

## 2021-04-25 ENCOUNTER — Ambulatory Visit: Attending: Clinical | Primary: Internal Medicine

## 2021-04-25 DIAGNOSIS — F332 Major depressive disorder, recurrent severe without psychotic features: Secondary | ICD-10-CM
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
## 2021-04-27 ENCOUNTER — Telehealth: Admit: 2021-04-27 | Discharge: 2021-04-27 | Attending: Psychiatry | Primary: Internal Medicine

## 2021-04-27 ENCOUNTER — Telehealth: Attending: Psychiatry | Primary: Internal Medicine

## 2021-04-27 DIAGNOSIS — F330 Major depressive disorder, recurrent, mild: Secondary | ICD-10-CM

## 2021-04-27 MED ORDER — CLONAZEPAM 1 MG TAB
1 mg | ORAL_TABLET | Freq: Every evening | ORAL | 3 refills | Status: AC
Start: 2021-04-27 — End: ?

## 2021-04-27 MED ORDER — BUPROPION SR 100 MG TAB
100 mg | ORAL_TABLET | Freq: Every day | ORAL | 3 refills | Status: AC
Start: 2021-04-27 — End: ?

## 2021-04-27 MED ORDER — DULOXETINE 60 MG CAP, DELAYED RELEASE
60 mg | ORAL_CAPSULE | Freq: Every day | ORAL | 3 refills | Status: AC
Start: 2021-04-27 — End: ?

## 2021-04-27 MED ORDER — DULOXETINE 30 MG CAP, DELAYED RELEASE
30 mg | ORAL_CAPSULE | ORAL | 0 refills | Status: AC
Start: 2021-04-27 — End: ?

## 2021-04-27 NOTE — Progress Notes (Addendum)
Progress  Notes by Mackey Birchwood, MD at 04/27/21 1440                Author: Mackey Birchwood, MD  Service: --  Author Type: Physician       Filed: 04/30/21 0847  Encounter Date: 04/27/2021  Status: Signed          Editor: Mackey Birchwood, MD (Physician)               The note has been blocked for the following reason: Likely risk of substantial harm                         **Sensitive Note**                                 Patient:  Karen Logan   Age:  40 y.o.  DOB:  01/14/1981       SEX:  female MRN:   [id number]       RACE: AMERICAN INDIAN/[place] NAT[place]   WHITE/NON-HISPANIC               SEEN:   [x]     PATIENT   []     SPOUSE  []    OTHER:                       3 most recent PHQ Screens  04/27/2021  04/25/2021  03/29/2021          Little interest or pleasure in doing things  Nearly every day  Nearly every day  Not at all     Feeling down, depressed, irritable, or hopeless  More than half the days  Nearly every day  Not at all     Total Score PHQ 2  5  6   0     Trouble falling or staying asleep, or sleeping too much  Nearly every day  More than half the days  -     Feeling tired or having little energy  Not at all  Nearly every day  -     Poor appetite, weight loss, or overeating  Not at all  Nearly every day  -     Feeling bad about yourself - or that you are a failure or have let yourself or your family down  Nearly every day  Nearly every day  -     Trouble concentrating on things such as school, work, reading, or watching TV  Not at all  More than half the days  -     Moving or speaking so slowly that other people could have noticed; or the opposite being so fidgety that others notice  Nearly every day  Nearly every day  -     Thoughts of being better off dead, or hurting yourself in some way  Not at all  Not at all  -     PHQ 9 Score  14  22  -          How difficult have these problems made it for you to do your work, take care of your home and get along with others  Very difficult  Very difficult  -                 GAD 2/7  04/27/2021  02/01/2021  11/09/2020          Feeling nervous, anxious,  or on edge  3  2  3      Not being able to stop or control worrying  3  3  3      Worrying too much about different things  3  3  3      Trouble relaxing  3  3  3      Being so restless that it is hard to sit still  2  3  3      Becoming easily annoyed or irritable  0  0  0     Feeling afraid as if something awful might happen  3  3  3           GAD-7 Total Score  17  17  18            I was at home while conducting this encounter.      Consent:   She and/or her healthcare decision  maker is aware that this patient-initiated Telehealth encounter is a billable service, with coverage as determined by her insurance carrier.  She is aware that she may receive a bill and has provided verbal consent to proceed:  Yes..  Patient identification was verified, and a caregiver was present when  appropriate. The patient was located in a state where the provider was credentialed to provide care.            This virtual visit was conducted via [email or web address].  Pursuant to the emergency declaration under the [place], 1135 waiver authority and the [employer] Act, this Virtual  Visit was conducted to reduce the patient's risk of exposure to COVID-19 and provide continuity of care for an established patient.  Services were provided through a video synchronous discussion  virtually to substitute for in-person clinic visit.  Due to this being a TeleHealth evaluation, many elements of the physical examination are unable to be assessed.       Total Time: minutes: 11-20 minutes.          Chief complaint:  Pt says she has been sleeping a lot.      Subjective:   Virtually for follow-up.  States has been sleeping a lot.  States felt like Wellbutrin was too strong for her so cut it into half and has been doing well on the half tablet.  Explained to her that it  is a sustained-release  tablet which is coated and taking it to have [place] and regular Wellbutrin.  Does more work around the house and in the garden.  Has movements of depression and anxiety.  Forgot to take Celexa for 2 days and realized that it helps.   Has stopped traveling.  Sober and attends meetings.  Works as an Passenger transport manager for [employer].  Getting along with her husband good.  Supportive psychotherapy provided.  She denies suicidal ideation/homicidal ideations.  Denies symptoms psychosis.   We will switch from Zoloft to Cymbalta.  Crossover explained.  Continue Wellbutrin at the current dose for now.        Patient Active Problem List        Diagnosis  Code         ?  Depression  F32.A     ?  Hypertension  I10     ?  Tinnitus of both ears  H93.13     ?  Class 3 severe obesity without serious comorbidity with body mass index (BMI) of 40.0 to 44.9 in adult [facility])  E66.01, Z68.41         ?  Intractable migraine without aura and without status migrainosus  G43.019        LMP 04/11/21, Birth control,    Not pregnant      Denies palpitation,SOB, Chest pain, headaches. In no acute distress.          MEDICATION REVIEW:      Current Medications:       Current Outpatient Medications        Medication  Sig         ?  clonazePAM (KlonoPIN) 1 mg tablet  Take 1 Tablet by mouth nightly. Max Daily Amount: 1 mg.     ?  buPROPion SR (WELLBUTRIN SR) 100 mg SR tablet  Take 1 Tablet by mouth daily.     ?  DULoxetine (CYMBALTA) 30 mg capsule  take 30 mg once daily for 3 weeks then 60 mg once daily in the morning.Switching form zoloft to cymbalta     ?  DULoxetine (CYMBALTA) 60 mg capsule  Take 1 Capsule by mouth daily. take 30 mg once daily for 3 weeks then 60 mg once daily in the morning. Please fill the 30 mg prescription first. Switching  form zoloft to cymbalta     ?  hydroCHLOROthiazide (HYDRODIURIL) 25 mg tablet  Take 0.5 Tablets by mouth daily for 30 days.     ?  valsartan (DIOVAN) 40 mg tablet  Take 1 Tablet by mouth daily for 90 days.      ?  acetaZOLAMIDE SR (DIAMOX) 500 mg capsule  Take 1 Capsule by mouth two (2) times a day. Indications: pseudotumor cerebri, a condition with high fluid pressure in the brain     ?  rimegepant (Nurtec ODT) 75 mg disintegrating tablet  Take 1 Tablet by mouth once as needed for Migraine for up to 1 dose. Indications: a migraine headache     ?  melatonin 5 mg cap capsule  Take  by mouth nightly.     ?  sertraline (ZOLOFT) 100 mg tablet  Take 2 Tablets by mouth daily.     ?  PNV BH.41/PFXTKWI fum/folic ac (PRENATAL PO)  Take  by mouth.     ?  ascorbic acid (VITAMIN C PO)  Take  by mouth.     ?  cholecalciferol (VITAMIN D3) (5000 Units/125 mcg) tab tablet  Take  by mouth daily.     ?  cyanocobalamin, vitamin B-12, (VITAMIN B-12 PO)  Take  by mouth.         ?  promethazine (PHENERGAN) 12.5 mg tablet  Take 1 Tablet by mouth every eight (8) hours as needed for Nausea (vomiting) for up to 10 doses.          No current facility-administered medications for this visit.             Allergies        Allergen  Reactions         ?  Seasonale [Levonorgestrel-Ethinyl Estrad]  Itching         ?  Lortab [Hydrocodone-Acetaminophen]  Nausea Only and Swelling           Past Medical History, Past Surgical History, Family history, Social History, and Medications were all reviewed with the patient today and updated as necessary.       Compliant with medication:  yes     Side effects from medications:    no      EXAMINATION  Musculoskeletal                GAIT AND STATION    [x]   WNL    []   RESTRICTED    []   UNSTEADY WALK                       []   ABNORMAL    []   UNBALANCED                PSYCHIATRIC               GENERAL APPEARANCE:    [x]     WELL GROOMED  []        DISHEVELED    []     UNKEMPT                 []     UNUSUAL/BIZZARE     []   WNL                    ATTITUDE:    [x]   COOPERATIVE    []   GUARDED    []   SUSPICIOUS                 []   HOSTILE                                           BEHAVIOR:    [x]   CALM    []   HYPERACTIVE     []   MANNERISMS                 []   BIZZARE                          SPEECH:    [x]   NORMAL FOR CLIENT    []   SPONTANEOUS    []   SLURRED    []   WHISPERING                   []   LOUD    []   PRESSURED    []   ARTICULATE                    EYE CONTACT:    [x]   WNL    []   BLANK STARE    []   INTENSE                 []   AVOIDANT                        MOOD:    []   EUTHYMIC    [x]   ANXIOUS    [x]   DEPRESSED                 []   IRRITABLE    []   ANGRY    []   APATHETIC                AFFECT:    [x]   CONGRUENT WITH MOOD    []   FLAT    []   CONSTRICTED                 []   INAPPROPRIATE    []   LABILE  THOUGHT PROCESS:    [x]   LOGICAL/GOAL-DIRECTED    []   FOI    []   CIRCUMSANTIAL                 []   INCOHERENT    []   TANGENTIAL    []   CONCRETE                 []   PERSEVERATION                              THOUGHT CONTENT:                                        DELUSIONS   [x]   DENIES   []   GRANDIOSE   []   PERSECUTORY   []   RELIGIOUS   []   REFERENCE     HALLUCINATIONS   [x]   DENIES   []   AUDITORY   []   VISUAL   []   OLFACTORY   []   TACTILE        []   GUSTATORY   []   SOMATIC                             OBSESSIONS   [x]   DENIES   []   PRESENT                             SUICIDAL IDEATION   [x]   DENIES   []   PRESENT W/O PLAN   []   PRESENT W/ PLAN                         HOMICIDAL IDEATION   [x]   DENIES   []   PRESENT W/O PLAN   []   PRESENT W/ PLAN                           JUDGEMENT:    [x]   GOOD    []   FAIR    []   POOR             INSIGHT:    [x]   GOOD    []   FAIR    []   POOR                  COGNITION:                               SENSORIUM:    [x]   ALERT    []   CLOUDED    []   DROWSY                   ORIENTATION:    [x]   INTACT    []   TIME:    PLACE    PERSON               RECENT & REMOTE MEMORY:    []   NORMAL    [x]   OTHER:  ATTENTION:    [x]   INTACT    []   MILD IMPAIRMENT    []   SEVERE IMPAIRMENT                   CONCENTRATION:    [x]   INTACT    []    MILD IMPAIRMENT    []   SEVERE IMPAIRMENT                   LANGUAGE:    [x]   AVERAGE    []   ABOVE AVERAGE    []   BELOW AVERAGE         FUND OF KNOWLEDGE:    []   UNABLE TO ASSESS AT THIS TIME    [x]   AVERAGE    []   ABOVE AVERAGE    []   BELOW AVERAGE                   []   GOOD TO EXCELLENT KNOWLEDGE OF CURRENT EVENTS    []   POOR TO NO KNLEDGE OF CURRENT EVENTS                             ABNORMAL MOVEMENTS:    [x]   NONE    []   TICS    []   TREMORS    []   BIZZARE                   []   FACE    []   TRUNK    []   EXTREMETIES    []   GESTURES                    SLEEP:    [x]   GOOD    []   FAIR    []   POOR                 MUSCLE STRENGTH AND TONE    [x]   WNL    []   ATROPHY    []   SPASTIC                    []   FLACCID    []   COGWHEEL               Diagnoses/Impressions:             ICD-10-CM  ICD-9-CM             1.  Mild episode of recurrent major depressive disorder (HCC)   F33.0  296.31       2.  Generalized anxiety disorder with panic attacks   F41.1  300.02  clonazePAM (KlonoPIN) 1 mg tablet            F41.0  300.01             3.  Insomnia due to mental disorder   F51.05  300.9                327.02             TREATMENT GOALS:   Symptom reduction, Medication adherence, maintain therapeutic gains      LABS/IMAGING:             []    Ordered  [x]    Reviewed  []    New Labs Ordered:        LAB     WBC          Date/Time  Value  Ref Range  Status          02/21/2021 03:22 PM  9.1  4.3 - 11.1 K/uL  Final          HGB          Date/Time  Value  Ref Range  Status          02/21/2021 03:22 PM  12.1  11.7 - 15.4 g/dL  Final          HCT          Date/Time  Value  Ref Range  Status          02/21/2021 03:22 PM  37.8  35.8 - 46.3 %  Final          PLATELET          Date/Time  Value  Ref Range  Status          02/21/2021 03:22 PM  323  150 - 450 K/uL  Final          Sodium          Date/Time  Value  Ref Range  Status          02/21/2021 03:22 PM  141  136 - 145 mmol/L  Final          Potassium          Date/Time  Value  Ref Range   Status          02/21/2021 03:22 PM  3.7  3.5 - 5.1 mmol/L  Final          Chloride          Date/Time  Value  Ref Range  Status          02/21/2021 03:22 PM  106  98 - 107 mmol/L  Final          CO2          Date/Time  Value  Ref Range  Status          02/21/2021 03:22 PM  29  21 - 32 mmol/L  Final          BUN          Date/Time  Value  Ref Range  Status          02/21/2021 03:22 PM  7  6 - 23 MG/DL  Final          Creatinine          Date/Time  Value  Ref Range  Status          02/21/2021 03:22 PM  0.80  0.6 - 1.0 MG/DL  Final          Glucose          Date/Time  Value  Ref Range  Status          02/21/2021 03:22 PM  100  65 - 100 mg/dL  Final          ALT (SGPT)          Date/Time  Value  Ref Range  Status          03/15/2021 02:19 PM  10  0 - 32 IU/L  Final          AST (SGOT)          Date/Time  Value  Ref Range  Status          03/15/2021 02:19 PM  14  0 - 40 IU/L  Final          Alk. phosphatase          Date/Time  Value  Ref Range  Status          03/15/2021 02:19 PM  84  44 - 121 IU/L  Final          TSH          Date/Time  Value  Ref Range  Status          09/01/2020 03:02 PM  0.818  0.450 - 4.500 uIU/mL  Final           Please refer to the lab tab in the epic and care everywhere for the most recent lab results.      Plan:       [x]    Medication ordered: cymbalta, klonopin, wellbutrin to target depression, anxiety, insomnia, switch form zoloft to cymbalta.      [x]    Medication education/counseling provided   Medication dosage and time to take, purpose/expected benefits/risks, common side effects, lab monitoring required and reason, expected length of treatment, risk of no treatment, effects on pregnancy/nursing,  financial availability. Educated patient on  side effects/risks/benefits of meds including cardiac arrhythmias, suicidal ideations, orthostatic hypotension, serotonin syndrome, risk of mania/hypomania from antidepressants, withdrawals from abrupt discontinuation  of meds, liver toxicity, risk of  bleeding, risk of seizures, addiction potential, memory impairment with long term use of benzos, respiratory depression, high blood pressure, dizziness, drowsiness, sedation , risk of falls, Risk & benefits discussed:  including but not limited to possible off-label medication usage.         [x]   Follow [facility] for sxs improvement       I have reviewed the patients controlled substance prescription history, as maintained in the [place] prescription monitoring program, so that the prescription(s) for a  controlled substance can be given.      Recommendations and Referrals:      Follow up with : MD, requires monitoring of response to medication, requires monitoring of medication side effects.      Time until next PMA:       Follow up with Mental Health Clinicians: psychotherapy interventions, improve level of functioning, monitoring to prevent decompensation /hospitalization, monitoring to maintain therapeutic gains, symptoms (resolving and controlled)               Psychotherapy note:                                __15_ Minutes of psychotherapy       [x]   Supportive psychotherapy, Patient discussed  certain situational and personal stressors ongoing in her life at this time, weight management d/w the patient. Sleep hygiene d/w patient. Patient allowed to vent out her emotions.   Scenarios were reviewed using role playing and CBT techniques in order to increase insight and decrease anxiety.         []   Disposition planning        []   Dangerous and will not contract for safety  in the community      **[place]int has been notified: They are to call 911 or go to their nearest E.R. if they are experiencing a medical emergency or suicidal ideations/homicidal ideations.**   All ancillary documentation entered reviewed by provider.        PLEASE NOTE:  This document has been produced in part or whole using voice recognition  software. Proofread however unrecognized errors in transcription may be present.            Mackey Birchwood, MD               Feels Wellbutrin is too strong. Cut the tablet in half and it seemed to work better, would like a dose decrease. Causes increased agitation.

## 2021-05-04 NOTE — Telephone Encounter (Signed)
MTHFR is in the yellow

## 2021-05-05 MED ORDER — L-METHYLFOLATE 7.5 MG PO TABS
7.5 | ORAL_TABLET | Freq: Every day | ORAL | 2 refills | Status: DC
Start: 2021-05-05 — End: 2023-04-19

## 2021-05-05 NOTE — Telephone Encounter (Signed)
Sent prescription. Please clal her and let her know that she needs to buy it online if it expensive at the pharmacy.Thanks

## 2021-05-06 NOTE — Telephone Encounter (Signed)
Notified patient

## 2021-05-11 ENCOUNTER — Encounter: Attending: Neurology | Primary: Internal Medicine

## 2021-05-11 ENCOUNTER — Ambulatory Visit: Admit: 2021-05-11 | Discharge: 2021-05-11 | Attending: Neurology | Primary: Internal Medicine

## 2021-05-11 DIAGNOSIS — G932 Benign intracranial hypertension: Secondary | ICD-10-CM

## 2021-05-11 NOTE — Telephone Encounter (Signed)
PT new prescription is not cover by insurance please call pt back also needs to verify quantity please call back thank you.

## 2021-05-11 NOTE — Telephone Encounter (Signed)
Returned call to patient.

## 2021-05-11 NOTE — Progress Notes (Signed)
[facility]UROLOG[facility]  [address], [address]  [place], [place] [address]  Phone: [phone] Fax [phone]  Dr. Amador Cunas      05/11/2021  Karen Logan     Patient is referred by the following provider for consultation regarding as below:       I reviewed the available records and notes and have examined patient with the following findings:     Chief Complaint:  Chief Complaint   Patient presents with   ??? Follow-up   ??? Migraine          HPI: This is a right handed 40 y.o. Married female who is here for a second opinion.  The patient is extremely frustrated.  The patient states that when she was about 89 or 40 years old the patient had severe headaches flashing lights and blurred vision.  She was very skinny she was sports related she was outgoing took her several doctors before she went to a neurologist who looked in her left eye and found papilledema diagnosed her with pseudotumor cerebri did a high-volume spinal tap which almost resolved her headaches immediately not to return until she started relocating here to [place] in 21 end of 2020 beginning of 2021.  Around June 2020 she started having an increase in blood pressure and blood pressure changes.  By October 2020 she started getting her headaches back.  These are headaches and visual changes with the flashing lights.  She was just miserable.  But she was in the process of relocating here she went to the emergency room they did send her up to see Dr. Jerl Santos from neurology department.  She put the patient on Diamox 500 mg twice a day after discontinuing Topamax because she had bad side effects from Topamax.  She has not had an eye evaluation or respinal tap she had an MRI of her brain which I looked at and was not appreciating any and any abnormalities they diagnosed to the radiologist suggest that this is pseudotumor based on the bilateral ventricular systems being a little bit smaller and optic sheath dilation.  I do not  appreciate the ventricular changes.  The optic sheath is difficult to appreciate as well.  Sed rate and C-reactive protein were normal.  She was started on Cymbalta for pain by her psychiatrist but does not seem to have helped much.    IMAGING REVIEW:  I REVIEWED PERTINENT  IMAGES AND REPORTS WITH THE PATIENT PERSONALLY, DIRECTLY AND FULLY.     Past Medical History:  Past Medical History:   Diagnosis Date   ??? Allergies    ??? Anxiety    ??? Bilateral calf pain 09/01/2020   ??? Bladder problem    ??? Carpal tunnel syndrome    ??? Class 3 severe obesity without serious comorbidity with body mass index (BMI) of 40.0 to 44.9 in adult [facility]) 09/01/2020   ??? Depression    ??? Encounter to establish care 09/01/2020   ??? Headache    ??? Hypertension    ??? Intractable migraine without aura and without status migrainosus 03/15/2021   ??? Tinnitus of both ears 09/01/2020       Past Surgical History:  Past Surgical History:   Procedure Laterality Date   ??? COLONOSCOPY     ??? LUMBAR FUSION  2002    patient denies this surgery on 11/09/20   ??? PELVIC LAPAROSCOPY  2002   ??? TONSILLECTOMY     ??? WISDOM TOOTH EXTRACTION Bilateral  Social History:  Social History     Socioeconomic History   ??? Marital status: Married     Spouse name: Not on file   ??? Number of children: Not on file   ??? Years of education: Not on file   ??? Highest education level: Not on file   Occupational History   ??? Not on file   Tobacco Use   ??? Smoking status: Never Smoker   ??? Smokeless tobacco: Never Used   Substance and Sexual Activity   ??? Alcohol use: Never   ??? Drug use: Never   ??? Sexual activity: Not on file   Other Topics Concern   ??? Not on file   Social History Narrative   ??? Not on file     Social Determinants of Health     Financial Resource Strain:    ??? Difficulty of Paying Living Expenses: Not on file   Food Insecurity:    ??? Worried About Running Out of Food in the Last Year: Not on file   ??? Ran Out of Food in the Last Year: Not on file   Transportation Needs:    ??? Lack of  Transportation (Medical): Not on file   ??? Lack of Transportation (Non-Medical): Not on file   Physical Activity:    ??? Days of Exercise per Week: Not on file   ??? Minutes of Exercise per Session: Not on file   Stress:    ??? Feeling of Stress : Not on file   Social Connections:    ??? Frequency of Communication with Friends and Family: Not on file   ??? Frequency of Social Gatherings with Friends and Family: Not on file   ??? Attends Religious Services: Not on file   ??? Active Member of Clubs or Organizations: Not on file   ??? Attends BankerClub or Organization Meetings: Not on file   ??? Marital Status: Not on file   Intimate Partner Violence:    ??? Fear of Current or Ex-Partner: Not on file   ??? Emotionally Abused: Not on file   ??? Physically Abused: Not on file   ??? Sexually Abused: Not on file   Housing Stability:    ??? Unable to Pay for Housing in the Last Year: Not on file   ??? Number of Places Lived in the Last Year: Not on file   ??? Unstable Housing in the Last Year: Not on file       Family History:   Family History   Problem Relation Age of Onset   ??? Cancer Father    ??? Hypertension Mother    ??? Heart Disease Father    ??? Colon Polyps Mother        Current Outpatient Medications on File Prior to Visit   Medication Sig Dispense Refill   ??? methylfolate (DEPLIN) 7.5 MG TABS tablet Take 7.5 tablets by mouth daily 30 tablet 2   ??? acetaZOLAMIDE (DIAMOX) 500 MG extended release capsule Take 500 mg by mouth 2 times daily     ??? buPROPion (WELLBUTRIN SR) 100 MG extended release tablet Take 100 mg by mouth daily     ??? vitamin D3 (CHOLECALCIFEROL) 125 MCG (5000 UT) TABS tablet Take by mouth daily     ??? clonazePAM (KLONOPIN) 1 MG tablet Take 1 mg by mouth.     ??? hydroCHLOROthiazide (HYDRODIURIL) 25 MG tablet Take 12.5 mg by mouth daily     ??? Melatonin 5 MG CAPS Take by mouth     ???  promethazine (PHENERGAN) 12.5 MG tablet Take 12.5 mg by mouth every 8 hours as needed     ??? Rimegepant Sulfate (NURTEC) 75 MG TBDP Take 75 mg by mouth once as needed      ??? sertraline (ZOLOFT) 100 MG tablet Take 200 mg by mouth daily     ??? valsartan (DIOVAN) 40 MG tablet Take 40 mg by mouth daily       No current facility-administered medications on file prior to visit.       Allergies   Allergen Reactions   ??? Levonorgestrel-Ethinyl Estrad Itching   ??? Hydrocodone-Acetaminophen Nausea Only and Swelling       Review of Systems:  Review of Systems   Constitutional: Negative.    HENT: Negative.    Eyes: Positive for visual disturbance.   Respiratory: Negative.    Cardiovascular: Negative.    Gastrointestinal: Negative.    Endocrine: Negative.    Genitourinary: Negative.    Musculoskeletal: Negative.    Skin: Negative.    Neurological: Positive for headaches.   Hematological: Negative.    Psychiatric/Behavioral: Negative.       No flowsheet data found.  No flowsheet data found.       Vitals:    05/11/21 0746   BP: 103/82   Pulse: 68   SpO2: 98%   Weight: 247 lb (112 kg)        Physical Exam  Constitutional:       Appearance: Normal appearance.   HENT:      Head: Normocephalic and atraumatic.   Eyes:      Extraocular Movements: Extraocular movements intact and EOM normal.      Comments: I attempted to look in the back of her eyes for papilledema but they became constricted making it very difficult.   Cardiovascular:      Rate and Rhythm: Normal rate and regular rhythm.      Pulses: Normal pulses.   Pulmonary:      Effort: Pulmonary effort is normal.   Abdominal:      Palpations: Abdomen is soft.   Neurological:      Mental Status: She is alert and oriented to person, place, and time.      Gait: Gait is intact.      Deep Tendon Reflexes:      Reflex Scores:       Tricep reflexes are 1+ on the right side and 1+ on the left side.       Bicep reflexes are 1+ on the right side and 1+ on the left side.       Brachioradialis reflexes are 1+ on the right side and 1+ on the left side.       Patellar reflexes are 1+ on the right side and 1+ on the left side.       Achilles reflexes are 1+ on the  right side and 1+ on the left side.         Neurologic Exam     Mental Status   Oriented to person, place, and time.   Attention: normal. Concentration: normal.   Level of consciousness: alert  Knowledge: good.     Cranial Nerves     CN II   Visual fields full to confrontation.     CN III, IV, VI   Extraocular motions are normal.     CN VII   Facial expression full, symmetric.     Motor Exam   Right arm tone: normal  Left arm  tone: normal  Right leg tone: normal  Left leg tone: normal    Gait, Coordination, and Reflexes     Gait  Gait: normal    Tremor   Resting tremor: absent  Intention tremor: absent  Action tremor: absent    Reflexes   Right brachioradialis: 1+  Left brachioradialis: 1+  Right biceps: 1+  Left biceps: 1+  Right triceps: 1+  Left triceps: 1+  Right patellar: 1+  Left patellar: 1+  Right achilles: 1+  Left achilles: 1+          Assessment   Assessment / Plan:    Karen Logan was seen today for follow-up and migraine.    Diagnoses and all orders for this visit:    Pseudotumor cerebri at this time we need to prove whether this patient truly has pseudotumor cerebri at this time or if this is some atypical form of a cluster migraine type of situation.  I do believe it is pseudotumor.  Were going to get an ASAP eye evaluation as well as a high-volume spinal tap.  We will keep Diamox 500 mg twice a day without any changes at this moment.        The Diagnosis and differential diagnostic considerations, and Rx Tx were reviewed with the patient at length.           No orders of the defined types were placed in this encounter.         I have spent greater than 50% of visit discussing and counseling of patient  for treatment and diagnostic plan review. Total time 40 min     .      Notes: Patient is to continue all medications as directed by prescribing physicians. Continuations on today's visit are made based on the patient's report of current medications.             Dr. Amador Cunas  Consultation Neurology,  Neurodiagnostics and Neurotherapeutics  Neuroelectrophysiology, EEG, EMG  [facility]  [address]  [place], [place] [address]  Phone:  [phone]  Fax:   [phone]

## 2021-05-11 NOTE — Progress Notes (Signed)
Patient informed as below:    The orders for your spinal tap/lumbar puncture have been sent to Interventional Radiology Department.      Please contact Interventional Radiology at [phone] and schedule an appointment that is convenient for you.    ** Prior to the procedure .Marland KitchenMake sure to avoid taking Aspirin or any blood thinners 5 days prior to your procedure date.  **    Interventional Radiology will discharge you with specific instructions.    Dr. Sharlet Salina asks that everyone follow the instructions provided by the Interventional Radiology Department.    Thanks,  Yonas Bunda, RMA  [employer].

## 2021-05-17 ENCOUNTER — Ambulatory Visit: Admit: 2021-05-17 | Discharge: 2021-05-17 | Attending: Clinical | Primary: Internal Medicine

## 2021-05-17 DIAGNOSIS — F411 Generalized anxiety disorder: Secondary | ICD-10-CM

## 2021-05-17 NOTE — Progress Notes (Signed)
Length of meeting; 58  minutes    Start Time: 1300  Stop Time:  1358    Diagnosis: Major depressive disorder, recurrent, moderate.  Generalized anxiety disorder with panic attacks.      Treatment Plan:      Patient Prognosis:  Guarded at present time.    Patient Progress:  Patient was tearful, talking about how she wants to cut all contact with her mother but then not wanting to do so as she is her mother.  She talked about several examples.  Touched briefly on part of this being setting herself up as being a victim, and we talked about setting boundaries with her mom, which she indicates she is "trying" to do.      We reviewed her top 3 self selected treatment goals from assignment relatng to same, in prior session.  Her top 3 are as follows:  Increasing self esteem, Feeling more self confident, and Worrying less.  Each of these is discussed at length.    I have assigned bid worry time for ten minutes each day, out loud, in front of a mirror.  I will follow up on this next time.      Mental Status exam:  PHQ-9 is done with a score of   17 ; her prior score was a  22.  No current active SI or HI.  The patient agrees today that if thoughts of self-harm or harm of others begins to occur that they will call 911, or go to the nearest ER or Urgent Care Center, prior to acting on these thoughts.   Thought processes appear normal.  Speech is goal directed.  The patient does not appear to be responding to internal stimuli.      Plan:  I will see this patient again on June 22nd at 1 pm, using MyChart for connection for VV.  Shelda Pal, LISW-CP

## 2021-05-20 ENCOUNTER — Inpatient Hospital Stay: Admit: 2021-05-20 | Primary: Internal Medicine

## 2021-05-20 DIAGNOSIS — R519 Headache, unspecified: Secondary | ICD-10-CM

## 2021-05-20 LAB — GLUCOSE, CSF: Glucose, CSF: 59 MG/DL (ref 40–70)

## 2021-05-20 LAB — BODY FLUID CELL COUNT
RBC, Fluid: 1 /mm3
WBC, Fluid: 0 /mm3

## 2021-05-20 LAB — PROTEIN, CSF: Protein, CSF: 44 MG/DL (ref 15–45)

## 2021-05-20 MED ORDER — LIDOCAINE HCL 2 % IJ SOLN
2 % | Freq: Once | INTRAMUSCULAR | Status: AC | PRN
Start: 2021-05-20 — End: 2021-05-20
  Administered 2021-05-20: 15:00:00 5 via INTRADERMAL

## 2021-05-20 MED ORDER — LIDOCAINE HCL 2 % IJ SOLN
2 | INTRAMUSCULAR | Status: AC
Start: 2021-05-20 — End: 2021-05-20

## 2021-05-20 MED FILL — XYLOCAINE 2 % IJ SOLN: 2 % | INTRAMUSCULAR | Qty: 10

## 2021-05-20 NOTE — Progress Notes (Signed)
Patient arrived to IR procedure room 1.

## 2021-05-20 NOTE — Op Note (Signed)
Department of Interventional Radiology  [id number]        Interventional Radiology Brief Procedure Note    Patient: Karen Logan MRN: [id number]  SSN: [id number]    Date of Birth: May 07, 1981  Age: 40 y.o.  Sex: female      Date of Procedure: 05/20/2021    Pre-Procedure Diagnosis: headaches    Post-Procedure Diagnosis: SAME    Procedure(s): Lumbar Puncture    Brief Description of Procedure: as above    Performed By: Cyd Silence, MD     Assistants: None    Anesthesia:Lidocaine    Estimated Blood Loss: None    Specimens:      Implants:      Findings: L3/4 opening pressure 12    Complications: None    Recommendations: routine post care     Follow Up: prn    Signed By: Cyd Silence, MD     May 20, 2021

## 2021-05-20 NOTE — Progress Notes (Signed)
Recovery period without difficulty. Pt alert and oriented and denies pain. Dressing is clean, dry, and intact. Reviewed discharge instructions with patient and spouse, both verbalized understanding. Pt escorted to lobby discharge area via wheelchair.

## 2021-05-20 NOTE — Discharge Instructions (Signed)
If you have any questions about your procedure, please call the Interventional Radiology department at [phone].   After business hours (5pm) and weekends, call the answering service at [phone] and ask for the Radiologist on call to be paged.     Si tiene Preguntas acerca del procedimiento, por favor llame al departamento de Radiologa Intervencional al [phone].   Despus de horas de oficina (5 pm) y los fines de semana, llamar al servicio de llamadas al [phone] y pregunte por el Radiologo de guardia.      Spinal Tap (Lumbar Puncture): What to Expect at Home  Your Recovery     A spinal tap (also called a lumbar puncture) is a test to check the fluid that surrounds and protects your spinal cord and brain. Your doctor may have done this test to look for an infection. Sometimes it's done to release pressurefrom too much fluid or to look for diseases such as multiple sclerosis.  You may feel tired, and your back may be sore where the needle went in (the puncture site). You may have a mild headache for a day or two. This can happen when some of the spinal fluid is removed. Drinking extra fluids, taking pain medicine, and lying down for several hours after the procedure may help the headache be less severe. Some people also have trouble sleeping for a day ortwo.  The fluid taken during a spinal tap is often sent to a lab for tests. Yourdoctor or nurse will call you with the test results.  This care sheet gives you a general idea about how long it will take for you to recover. But each person recovers at a different pace. Follow the steps belowto get better as quickly as possible.  How can you care for yourself at home?  Activity   . Your doctor may tell you to lie flat in bed for 1 to 4 hours after the procedure.    . Rest when you feel tired. Getting enough sleep will help you recover.    . Ask your doctor when you can drive again.   Diet   . Drink extra fluids after the procedure to help a  headache be less severe.   Medicines   . If you take aspirin or some other blood thinner, ask your doctor if and when to start taking it again. Make sure that you understand exactly what your doctor wants you to do.    . If you have pain, take pain medicines exactly as directed.  ? If the doctor gave you a prescription medicine for pain, take it as prescribed.  ? If you are not taking a prescription pain medicine, ask your doctor if you can take an over-the-counter medicine.    . If you think your pain medicine is making you sick to your stomach:  ? Take your medicine after meals (unless your doctor has told you not to).  ? Ask your doctor for a different pain medicine.    . If your doctor prescribed antibiotics, take them as directed. Do not stop taking them just because you feel better. You need to take the full course of antibiotics.   Follow-up care is a key part of your treatment and safety. Be sure to make and go to all appointments, and call your doctor if you are having problems. It's also a good idea to know your test results and keep alist of the medicines you take.  When should you call for   help?   Call 911 anytime you think you may need emergency care. For example, call if:   . You passed out (lost consciousness).   Call your doctor now or seek immediate medical care if:   . You have a new or higher fever and a stiff neck.    . You have a severe headache.    . You have any drainage or bleeding from the puncture site.    . You feel numb or lose strength below the puncture site.   Watch closely for changes in your health, and be sure to contact your doctor if:   . You still have a headache or sore back 2 days after your procedure.   Where can you learn more?  Go to [email or web address] and sign in to your MyChart account. Enter P586 in the Search Health Information box to learn more about "Spinal Tap (Lumbar Puncture): What to Expect at Home."     If you do not have an account,  please click on the "Sign Up Now" link.  Current as of: June 17, 2021Content Version: 13.2   2006-2022 [employer].   Care instructions adapted under license by [facility]. If you have questions about a medical condition or this instruction, always ask your healthcare professional. [employer] disclaims any warranty or liability for your use of this information.    Interventional Radiology General Nurse Discharge    After general anesthesia or intravenous sedation, for 24 hours or while taking prescription Narcotics:   Limit your activities   Do not drive and operate hazardous machinery   Do not make important personal or business decisions   Do  not drink alcoholic beverages   If you have not urinated within 8 hours after discharge, please contact your surgeon on call.    * Please give a list of your current medications to your Primary Care Provider.  * Please update this list whenever your medications are discontinued, doses are     changed, or new medications (including over-the-counter products) are added.  * Please carry medication information at all times in case of emergency situations.    These are general instructions for a healthy lifestyle:    No smoking/ No tobacco products/ Avoid exposure to second hand smoke  Surgeon General's Warning:  Quitting smoking now greatly reduces serious risk to your health.    Obesity, smoking, and sedentary lifestyle greatly increases your risk for illness  A healthy diet, regular physical exercise & weight monitoring are important for maintaining a healthy lifestyle    You may be retaining fluid if you have a history of heart failure or if you experience any of the following symptoms:  Weight gain of 3 pounds or more overnight or 5 pounds in a week, increased swelling in our hands or feet or shortness of breath while lying flat in bed.  Please call your doctor as soon as you notice any of these symptoms; do not wait until  your next office visit.    Recognize signs and symptoms of STROKE:  F-face looks uneven    A-arms unable to move or move unevenly    S-speech slurred or non-existent    T-time-call 911 as soon as signs and symptoms begin-DO NOT go       Back to bed or wait to see if you get better-TIME IS BRAIN.

## 2021-05-23 LAB — FLOW CYTOMETRY/HEMATOPATHOLOGY MISC

## 2021-06-01 ENCOUNTER — Encounter: Attending: Clinical | Primary: Internal Medicine

## 2021-07-08 ENCOUNTER — Encounter: Attending: Neurology | Primary: Internal Medicine

## 2021-07-11 ENCOUNTER — Encounter: Attending: Neurology | Primary: Internal Medicine

## 2021-08-04 ENCOUNTER — Telehealth: Admit: 2021-08-04 | Discharge: 2021-08-04 | Attending: Psychiatry | Primary: Internal Medicine

## 2021-08-04 DIAGNOSIS — F411 Generalized anxiety disorder: Secondary | ICD-10-CM

## 2021-08-04 MED ORDER — BUPROPION HCL ER (SR) 100 MG PO TB12
100 MG | ORAL_TABLET | Freq: Every morning | ORAL | 3 refills | Status: DC
Start: 2021-08-04 — End: 2021-11-01

## 2021-08-04 MED ORDER — SERTRALINE HCL 100 MG PO TABS
100 MG | ORAL_TABLET | Freq: Every morning | ORAL | 3 refills | Status: DC
Start: 2021-08-04 — End: 2021-11-01

## 2021-08-04 MED ORDER — CLONAZEPAM 1 MG PO TABS
1 MG | ORAL_TABLET | Freq: Two times a day (BID) | ORAL | 3 refills | Status: AC | PRN
Start: 2021-08-04 — End: 2021-12-02

## 2021-08-04 NOTE — Progress Notes (Signed)
Patient:  Karen PerkingSarah Beth Logan  Age:  40 y.o.  DOB:  27-Jan-1981     SEX:  female MRN:  [id number]     RACE: American [place] / [place] Native     SEEN:  [x]   PATIENT  []   SPOUSE []   OTHER:              PHQ-9  08/04/2021 05/17/2021 05/17/2021   Little interest or pleasure in doing things 0 1 1   Little interest or pleasure in doing things - - -   Feeling down, depressed, or hopeless 0 1 1   Trouble falling or staying asleep, or sleeping too much 2 3 3    Trouble falling or staying asleep, or sleeping too much - - -   Feeling tired or having little energy 1 3 3    Feeling tired or having little energy - - -   Poor appetite or overeating 1 3 2    Poor appetite, weight loss, or overeating - - -   Feeling bad about yourself - or that you are a failure or have let yourself or your family down 0 3 3   Feeling bad about yourself - or that you are a failure or have let yourself or your family down - - -   Trouble concentrating on things, such as reading the newspaper or watching television 1 2 3    Trouble concentrating on things such as school, work, reading, or watching TV - - -   Moving or speaking so slowly that other people could have noticed. Or the opposite - being so fidgety or restless that you have been moving around a lot more than usual 1 1 1    Moving or speaking so slowly that other people could have noticed; or the opposite being so fidgety that others notice - - -   Thoughts that you would be better off dead, or of hurting yourself in some way 0 0 0   Thoughts of being better off dead, or hurting yourself in some way - - -   PHQ-2 Score 0 2 2   Total Score PHQ 2 - - -   PHQ-9 Total Score 6 17 17    PHQ 9 Score - - -   If you checked off any problems, how difficult have these problems made it for you to do your work, take care of things at home, or get along with other people? 1 2 2    How difficult have these problems made it for you to do your work, take care of your home and get along with others - - -        GAD-7 SCREENING 08/04/2021 02/01/2021 11/09/2020   Feeling nervous, anxious, or on edge Nearly every day - -   Not being able to stop or control worrying Nearly every day - -   Worrying too much about different things Several days - -   Trouble relaxing Several days - -   Being so restless that it is hard to sit still Several days - -   Becoming easily annoyed or irritable Not at all - -   Feeling afraid as if something awful might happen Not at all - -   GAD-7 Total Score 9 - -   How difficult have these problems made it for you to do your work, take care of things at home, or get along with other people? Somewhat difficult Very difficult Very difficult   Feeling nervous, anxious, or on edge -  More than half the days Nearly every day   GAD-7 Total Score - 17 18        I was in the office while conducting this encounter.    Consent:  She and/or her healthcare decision maker is aware that this patient-initiated Telehealth encounter is a billable service, with coverage as determined by her insurance carrier. She is aware that she may receive a bill and has provided verbal consent to proceed: YesPatient identification was verified, and a caregiver was present when appropriate. The patient was located in a state where the provider was credentialed to provide care.    This virtual visit was conducted via [email or web address].  Pursuant to the emergency declaration under the [facility] Act and the [employer], 1135 waiver authority and the [employer] Act, this Virtual  Visit was conducted to reduce the patient's risk of exposure to COVID-19 and provide continuity of care for an established patient.  Services were provided through a video synchronous discussion virtually to substitute for in-person clinic visit.  Due to this being a TeleHealth evaluation, many elements of the physical examination are unable to be assessed.     Total Time: minutes: 11-20  minutes.    Chief complaint:  Pt says she is being on the current patient's except gets very anxious at work.    Subjective:  Seen virtually for follow-up.  States she sitting on the back porch for privacy because her husband is working from home.  Work is going well.  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  She used to work for 15 years ago.  She likes the new job.  Has been taking Wellbutrin one half twice a day.  Advised to take the whole Wellbutrin in the morning.  Increase the dose of Zoloft titrated up to 200 mg daily.  Advised to take Klonopin half tablet in the morning and half in the afternoon.  Patient has been taking it in the evening to help her sleep.  States she will try melatonin to help her sleep.  States being investigated for pseudotumor cerebri.  Had a spinal tap. Diagnosed with COVID 2 days ago. Supportive psychotherapy provided.  States gets along with her husband good.  She denies suicidal ideation/homicidal ideations.  Denies symptoms psychosis.    Patient Active Problem List   Diagnosis    Depression    Hypertension    Class 3 severe obesity without serious comorbidity with body mass index (BMI) of 40.0 to 44.9 in adult [facility])    Tinnitus of both ears    Intractable migraine without aura and without status migrainosus     LMP 07/31/2021, Birth control,   Not pregnant    Denies palpitation,SOB, Chest pain, headaches. In no acute distress.       MEDICATION REVIEW:    Current Medications:    Current Outpatient Medications   Medication Sig    Prenatal Vit-Fe Fumarate-FA (PRENATAL PO) Take by mouth    buPROPion (WELLBUTRIN SR) 100 MG extended release tablet Take 1 tablet by mouth every morning    clonazePAM (KLONOPIN) 1 MG tablet Take 0.5 tablets by mouth 2 times daily as needed for Anxiety for up to 120 days.    sertraline (ZOLOFT) 100 MG tablet Take 2 tablets by mouth every morning    vitamin D3 (CHOLECALCIFEROL) 125 MCG (5000 UT) TABS tablet Take by mouth daily    hydroCHLOROthiazide (HYDRODIURIL) 25  MG tablet Take 12.5 mg by mouth daily  Melatonin 5 MG CAPS Take by mouth    valsartan (DIOVAN) 40 MG tablet Take 40 mg by mouth daily    methylfolate (DEPLIN) 7.5 MG TABS tablet Take 7.5 tablets by mouth daily (Patient not taking: Reported on 08/04/2021)    acetaZOLAMIDE (DIAMOX) 500 MG extended release capsule Take 500 mg by mouth 2 times daily (Patient not taking: Reported on 08/04/2021)    promethazine (PHENERGAN) 12.5 MG tablet Take 12.5 mg by mouth every 8 hours as needed (Patient not taking: Reported on 08/04/2021)    Rimegepant Sulfate (NURTEC) 75 MG TBDP Take 75 mg by mouth once as needed (Patient not taking: Reported on 08/04/2021)     No current facility-administered medications for this visit.       Allergies   Allergen Reactions    Levonorgestrel-Ethinyl Estrad Itching    Hydrocodone-Acetaminophen Nausea Only and Swelling       Past Medical History, Past Surgical History, Family history, Social History, and Medications were all reviewed with the patient today and updated as necessary.     Compliant with medication: Yes   Side effects from medications:  No     EXAMINATION  Musculoskeletal    GAIT AND STATION   [x]  WNL   []  RESTRICTED   []  UNSTEADY WALK        []  ABNORMAL   []  UNBALANCED         PSYCHIATRIC     GENERAL APPEARANCE:   [x]   WELL GROOMED []      DISHEVELED   []   UNKEMPT      []   UNUSUAL/BIZZARE    []  WNL       ATTITUDE:   [x]  COOPERATIVE   []  GUARDED   []  SUSPICIOUS      []  HOSTILE                            BEHAVIOR:   [x]  CALM   []  HYPERACTIVE   []  MANNERISMS      []  BIZZARE         SPEECH:   [x]  NORMAL FOR CLIENT   []  SPONTANEOUS   []  SLURRED   []  WHISPERING      []  LOUD   []  PRESSURED   []  ARTICULATE       EYE CONTACT:   [x]  WNL   []  BLANK STARE   []  INTENSE      []  AVOIDANT         MOOD:   []  EUTHYMIC   [x]  ANXIOUS   []  DEPRESSED      []  IRRITABLE   []  ANGRY   []  APATHETIC     AFFECT:   [x]  CONGRUENT WITH MOOD   []  FLAT   []  CONSTRICTED      []  INAPPROPRIATE   []  LABILE           THOUGHT  PROCESS:   [x]  LOGICAL/GOAL-DIRECTED   []  FOI   []  CIRCUMSANTIAL      []  INCOHERENT   []  TANGENTIAL   []  CONCRETE      []  PERSEVERATION           THOUGHT CONTENT:                DELUSIONS  [x]  DENIES  []  GRANDIOSE  []  PERSECUTORY  []  RELIGIOUS  []  REFERENCE   HALLUCINATIONS  [x]  DENIES  []  AUDITORY  []  VISUAL  []  OLFACTORY  []  TACTILE     []   GUSTATORY   SOMATIC         OBSESSIONS   DENIES   PRESENT         SUICIDAL IDEATION   DENIES   PRESENT W/O PLAN   PRESENT W/ PLAN       HOMICIDAL IDEATION   DENIES   PRESENT W/O PLAN   PRESENT W/ PLAN           JUDGEMENT:    GOOD    FAIR    POOR   INSIGHT:    GOOD    FAIR    POOR     COGNITION:           SENSORIUM:    ALERT    CLOUDED    DROWSY     ORIENTATION:    INTACT    TIME:  PLACE  PERSON   RECENT & REMOTE MEMORY:    NORMAL    OTHER:                  ATTENTION:    INTACT    MILD IMPAIRMENT    SEVERE IMPAIRMENT     CONCENTRATION:    INTACT    MILD IMPAIRMENT    SEVERE IMPAIRMENT     LANGUAGE:    AVERAGE    ABOVE AVERAGE    BELOW AVERAGE     FUND OF KNOWLEDGE:    UNABLE TO ASSESS AT THIS TIME    AVERAGE    ABOVE AVERAGE    BELOW AVERAGE       GOOD TO EXCELLENT KNOWLEDGE OF CURRENT EVENTS    POOR TO NO KNLEDGE OF CURRENT EVENTS           ABNORMAL MOVEMENTS:    NONE    TICS    TREMORS    BIZZARE       FACE    TRUNK    EXTREMETIES    GESTURES        SLEEP:    GOOD    FAIR    POOR      MUSCLE STRENGTH AND TONE    WNL    ATROPHY    SPASTIC         FLACCID    COGWHEEL         Diagnoses/Impressions:    ICD-10-CM    1. Generalized anxiety disorder with panic attacks  F41.1     F41.0       2. Recurrent major depressive disorder, in partial remission (HCC)  F33.41       3. Primary insomnia  F51.01           TREATMENT GOALS:  Symptom reduction, Medication adherence, maintain therapeutic gains    LABS/IMAGING:      Ordered   Reviewed   New Labs Ordered:      LAB  WBC   Date/Time Value Ref Range Status   02/21/2021 03:22 PM 9.1 4.3 - 11.1 K/uL Final     Hemoglobin   Date/Time Value Ref Range Status   02/21/2021 03:22 PM 12.1 11.7 - 15.4 g/dL Final     Hematocrit   Date/Time Value Ref Range Status   02/21/2021 03:22 PM 37.8 35.8 - 46.3 % Final     Platelets   Date/Time Value Ref Range Status   02/21/2021 03:22 PM 323 150 - 450 K/uL Final     Sodium   Date/Time Value Ref Range Status   02/21/2021 03:22 PM 141 136 - 145 mmol/L Final  Potassium   Date/Time Value Ref Range Status   02/21/2021 03:22 PM 3.7 3.5 - 5.1 mmol/L Final     Chloride   Date/Time Value Ref Range Status   02/21/2021 03:22 PM 106 98 - 107 mmol/L Final     CO2   Date/Time Value Ref Range Status   02/21/2021 03:22 PM 29 21 - 32 mmol/L Final     BUN   Date/Time Value Ref Range Status   02/21/2021 03:22 PM 7 6 - 23 MG/DL Final     ALT   Date/Time Value Ref Range Status   03/15/2021 02:19 PM 10 0 - 32 IU/L Final     AST   Date/Time Value Ref Range Status   03/15/2021 02:19 PM 14 0 - 40 IU/L Final     TSH   Date/Time Value Ref Range Status   09/01/2020 03:02 PM 0.818 0.450 - 4.500 uIU/mL Final       Please refer to the lab tab in the epic and care everywhere for the most recent lab results.    Plan:     [x]   Medication ordered: Zoloft, Wellbutrin, Klonopin to target depression, anxiety, insomnia.    [x]   Medication education/counseling provided  Medication dosage and time to take, purpose/expected benefits/risks, common side effects, lab monitoring required and reason, expected length of treatment, risk of no treatment, effects on pregnancy/nursing, financial availability. Educated patient on  side effects/risks/benefits of meds including cardiac arrhythmias, suicidal ideations,  orthostatic hypotension, serotonin syndrome, risk of mania/hypomania from antidepressants, withdrawals from abrupt discontinuation of meds,  risk of bleeding, risk of seizures, addiction potential, memory impairment with long  term use of benzos, respiratory depression, high blood pressure, dizziness, drowsiness, sedation , risk of falls, Risk & benefits discussed: including but not limited to possible off-label medication usage.       [x]  Follow MSE for sxs improvement     I have reviewed the patient???s controlled substance prescription history, as maintained in the prescription monitoring program, so that the prescription(s) for a  controlled substance can be given.    Recommendations and Referrals:    Follow up with : MD, requires monitoring of response to medication, requires monitoring of medication side effects.    Time until next PMA:     Follow up with Mental Health Clinicians: psychotherapy interventions, improve level of functioning, monitoring to prevent decompensation /hospitalization, monitoring to maintain therapeutic gains, symptoms (resolving and controlled)           Psychotherapy note:                                __10_ Minutes of psychotherapy     [x]   Supportive psychotherapy, Patient discussed certain situational and personal stressors ongoing in her life at this time, weight management d/w the patient. Sleep hygiene d/w patient. Patient allowed to vent out her emotions.  Scenarios were reviewed using role playing and CBT techniques in order to increase insight and decrease anxiety.       []   Disposition planning      []   Dangerous and will not contract for safety in the community    **Pateint has been notified: They are to call 911 or go to their nearest E.R. if they are experiencing a medical emergency or suicidal ideations/homicidal ideations.**  All ancillary documentation entered reviewed by provider.      PLEASE NOTE:  This document has been produced  in part or whole using voice recognition software. Proofread however unrecognized errors in transcription may be present.        Daivd Council, MD            Had a spinal tap on 05/20/21    Diagnosed with covid 2 days ago.

## 2021-09-22 IMAGING — CT CT RENAL STONE PROTOCOL
2 of 4 series · 16 of 46 positions shown, 18 images · non-contrast
Comparison: None.

CLINICAL DATA: Left flank pain.  Kidney stones suspected.

EXAM:
CT ABDOMEN AND PELVIS WITHOUT CONTRAST
TECHNIQUE: Multidetector CT imaging of the abdomen and pelvis was performed
following the standard protocol without IV contrast.

[Series 2: axial st · axial · 0.57mm/px · z∈[+908,+1302]mm · 13 of 91 slices shown, 15 images]
[im 6/91  soft-tissue]
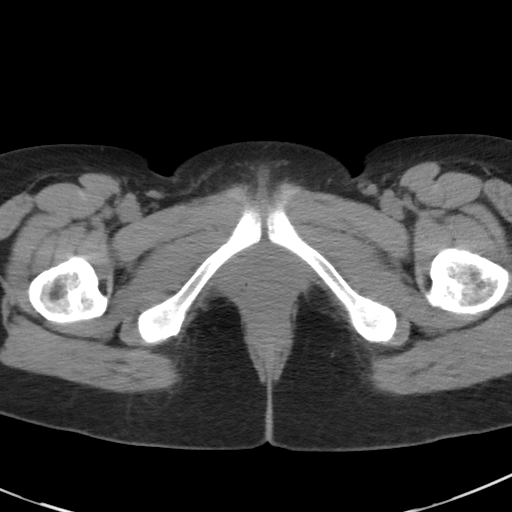
[im 6/91  bone]
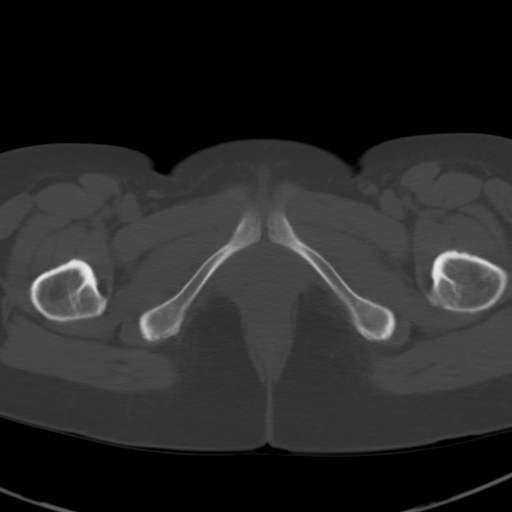
[im 12/91  soft-tissue]
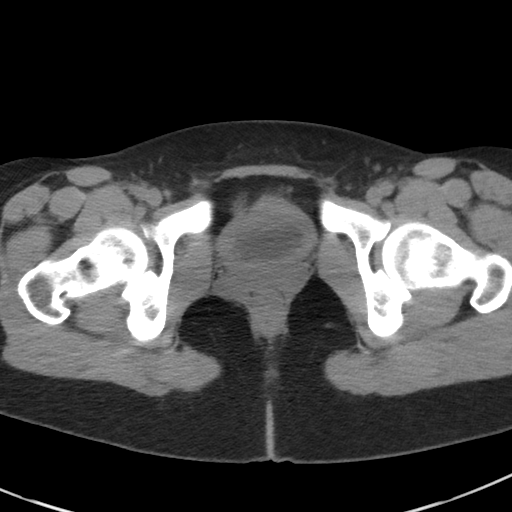
[im 17/91  soft-tissue]
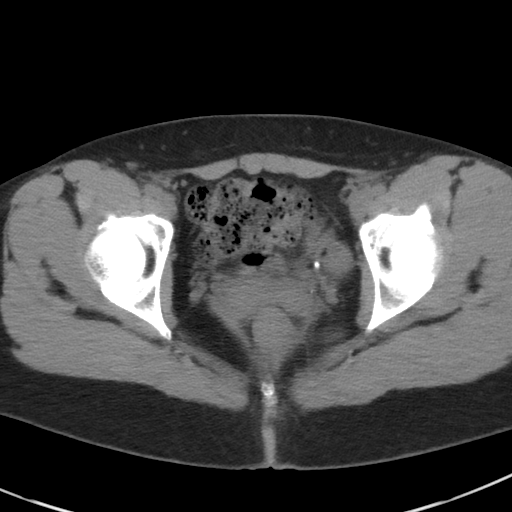
[im 29/91  soft-tissue]
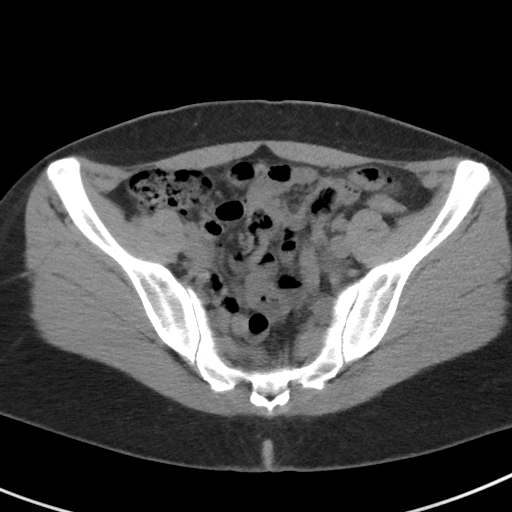
[im 34/91  soft-tissue]
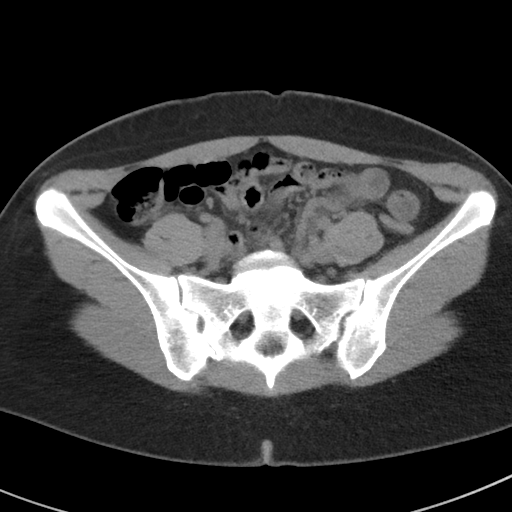
[im 40/91  soft-tissue]
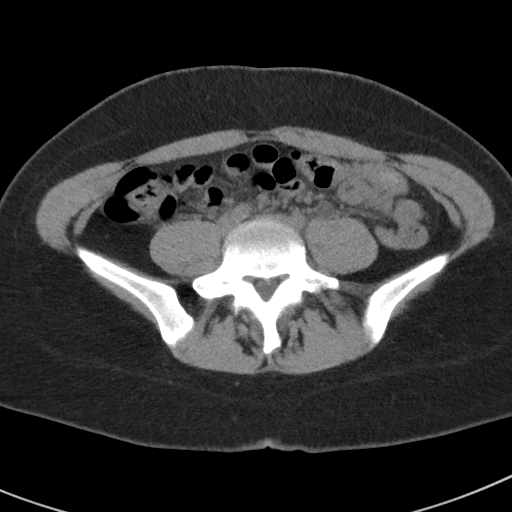
[im 46/91  soft-tissue]
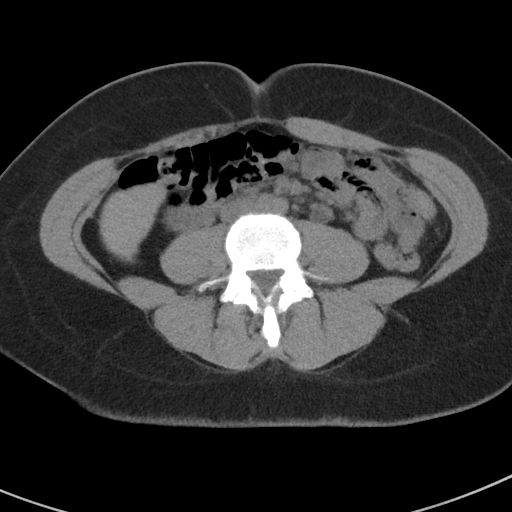
[im 51/91  soft-tissue]
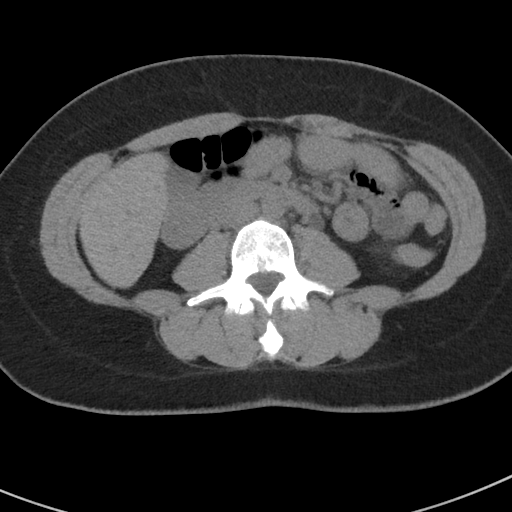
[im 57/91  soft-tissue]
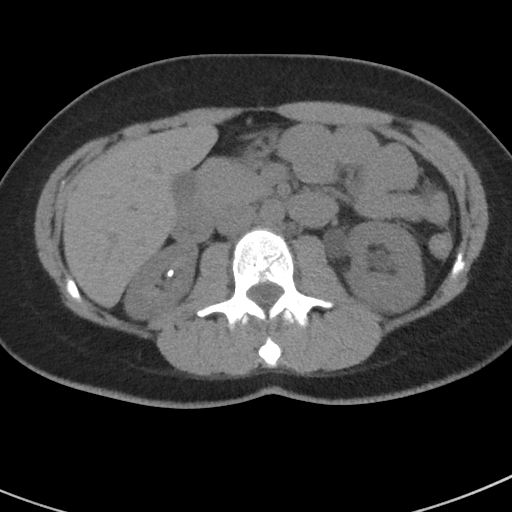
[im 57/91  bone]
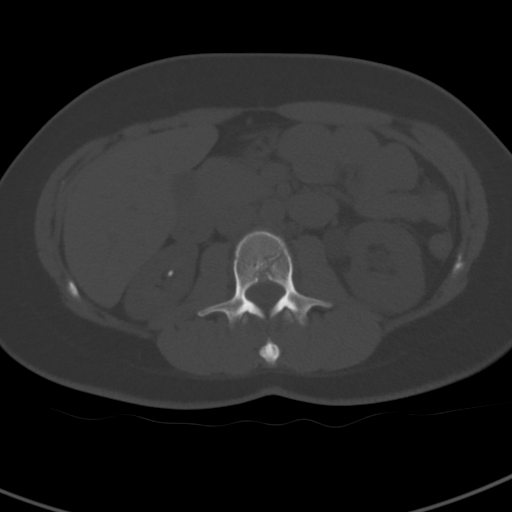
[im 62/91  soft-tissue]
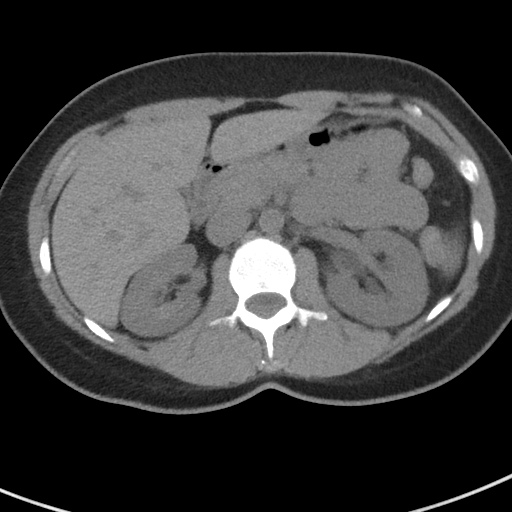
[im 74/91  soft-tissue]
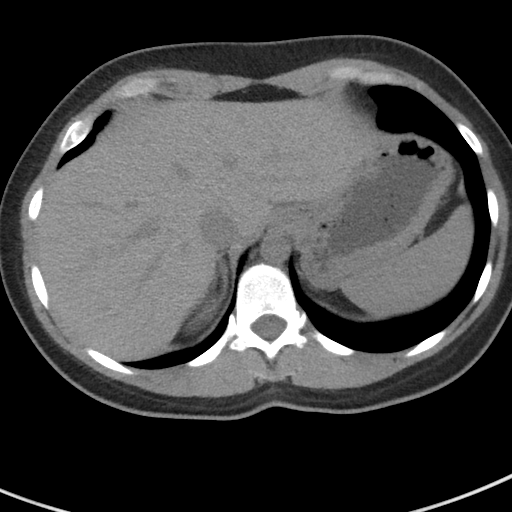
[im 79/91  soft-tissue]
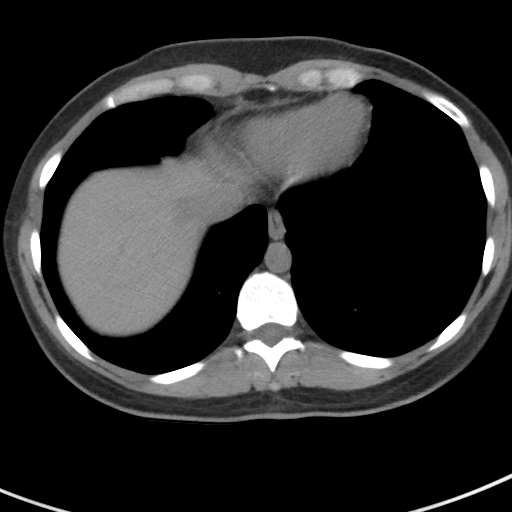
[im 85/91  soft-tissue]
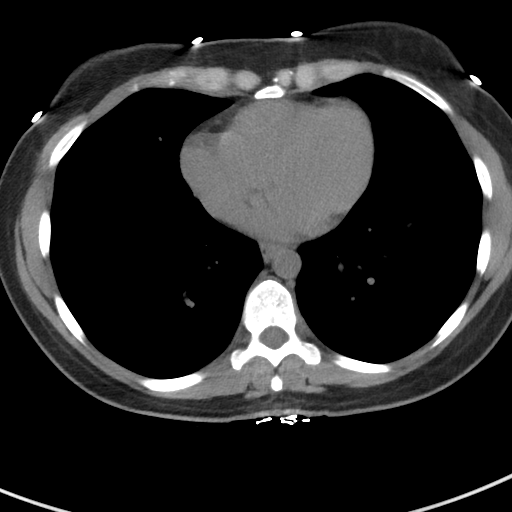

[Series 5: coronal st · coronal · 0.71mm/px · 3 of 101 slices shown]
[im 34/101  soft-tissue]
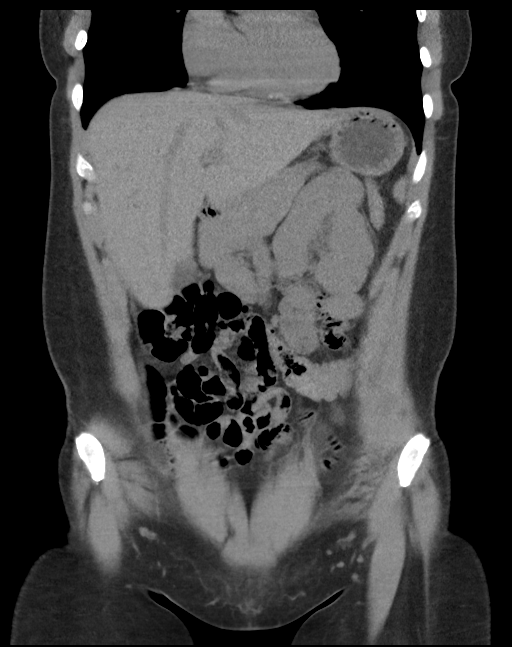
[im 45/101  soft-tissue]
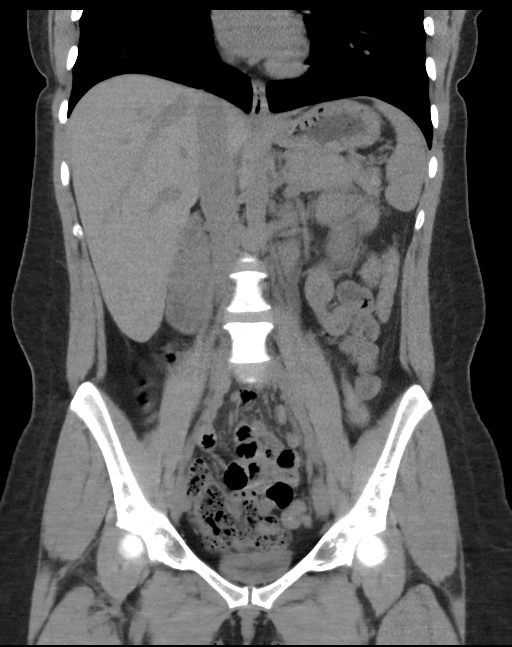
[im 56/101  soft-tissue]
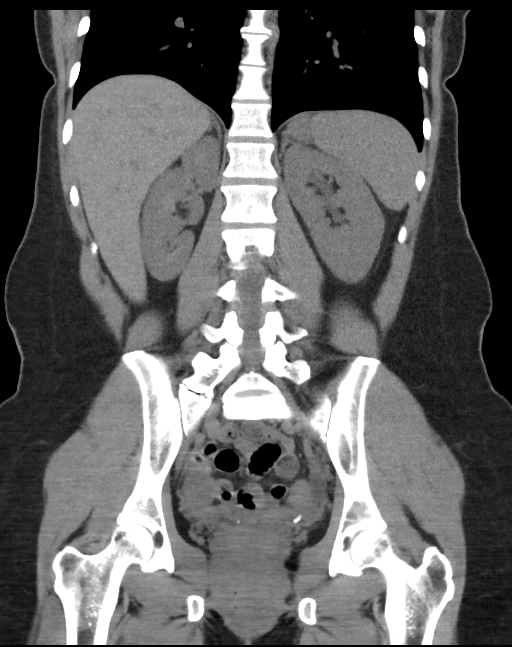

[16 of 46 positions shown; findings below may reference images not displayed]

FINDINGS: Lower chest: Unremarkable

Hepatobiliary: No focal abnormality in the liver on this study
without intravenous contrast. Possible tiny stone or gallbladder
polyp (axial 40/2 and coronal 36/5). No intrahepatic or extrahepatic
biliary dilation.

Pancreas: No focal mass lesion. No dilatation of the main duct. No
intraparenchymal cyst. No peripancreatic edema.

Spleen: No splenomegaly. No focal mass lesion.

Adrenals/Urinary Tract: No adrenal nodule or mass. 3 x 4 mm
nonobstructing stone identified lower pole right kidney. No right
ureteral stone. No secondary changes in the right kidney or ureter.
No stones are seen in the left kidney although there is mild to
moderate left hydroureteronephrosis. Left ureter is dilated down to
the pelvis where a 2 x 4 x 8 mm distal left ureteral stone is
identified (well demonstrated coronal 57/5 and visible on axial
74/2). Bladder is nondistended.

Stomach/Bowel: Stomach is unremarkable. No gastric wall thickening.
No evidence of outlet obstruction. Duodenum is normally positioned
as is the ligament of Treitz. No small bowel wall thickening. No
small bowel dilatation. No gross colonic mass. No colonic wall
thickening.

Vascular/Lymphatic: No abdominal aortic aneurysm. No abdominal
aortic atherosclerotic calcification. There is no gastrohepatic or
hepatoduodenal ligament lymphadenopathy. No retroperitoneal or
mesenteric lymphadenopathy. No pelvic sidewall lymphadenopathy.

Reproductive: Unremarkable.

Other: No intraperitoneal free fluid.

Musculoskeletal: No worrisome lytic or sclerotic osseous
abnormality.
IMPRESSION: 1. 2 x 4 x 8 mm distal left ureteral stone with mild to moderate
left hydroureteronephrosis.
2. 3 x 4 mm nonobstructing stone lower pole right kidney.
3. Possible tiny stone or gallbladder polyp.

## 2021-10-17 ENCOUNTER — Encounter

## 2021-10-17 NOTE — Telephone Encounter (Signed)
Mailbox full. I moved appt to work in before 12 but unable to reach her to let her know.

## 2021-10-17 NOTE — Telephone Encounter (Signed)
Pt next apt on 10/25/2021 pt I needing to do her apt before 12 pm, please call pt back thank you.

## 2021-10-17 NOTE — Telephone Encounter (Signed)
Referral pended at patient request please review and complete.

## 2021-10-17 NOTE — Telephone Encounter (Signed)
From: Marlynn Perking  To: Dr. [place] J Grnak  Sent: 10/17/2021 3:30 PM EST  Subject: Gynecologist    May I please have a referral for a gynecologist that is affiliated with [facility], please? I would prefer a woman, if possible.     Thank you,  Karen Logan

## 2021-10-25 ENCOUNTER — Encounter: Attending: Psychiatry | Primary: Internal Medicine

## 2021-10-25 NOTE — Telephone Encounter (Signed)
Called and scheduled appt

## 2021-10-25 NOTE — Telephone Encounter (Signed)
From: Marlynn Perking  To: Dr. Daivd Council  Sent: 10/25/2021 1:49 PM EST  Subject: Missed Appointment     I apologize for missing the appointment this morning. I have been in the Urgent Care all morning waiting to see a doctor for this virus I have. I now have service. Please let me know what I can do to make up the appointment. Thank you.

## 2021-11-01 ENCOUNTER — Telehealth: Admit: 2021-11-01 | Discharge: 2021-11-01 | Attending: Psychiatry | Primary: Internal Medicine

## 2021-11-01 DIAGNOSIS — F331 Major depressive disorder, recurrent, moderate: Secondary | ICD-10-CM

## 2021-11-01 MED ORDER — SERTRALINE HCL 100 MG PO TABS
100 MG | ORAL_TABLET | Freq: Every morning | ORAL | 3 refills | Status: DC
Start: 2021-11-01 — End: 2022-01-05

## 2021-11-01 MED ORDER — BUPROPION HCL ER (SR) 100 MG PO TB12
100 MG | ORAL_TABLET | Freq: Every morning | ORAL | 3 refills | Status: DC
Start: 2021-11-01 — End: 2022-02-22

## 2021-11-01 MED ORDER — CLONAZEPAM 1 MG PO TABS
1 MG | ORAL_TABLET | Freq: Two times a day (BID) | ORAL | 3 refills | Status: AC | PRN
Start: 2021-11-01 — End: 2022-03-01

## 2021-11-01 MED ORDER — HYDROCHLOROTHIAZIDE 25 MG PO TABS
25 MG | ORAL_TABLET | Freq: Every day | ORAL | 1 refills | Status: AC
Start: 2021-11-01 — End: 2022-05-22

## 2021-11-01 MED ORDER — VALSARTAN 40 MG PO TABS
40 MG | ORAL_TABLET | Freq: Every day | ORAL | 1 refills | Status: DC
Start: 2021-11-01 — End: 2022-01-05

## 2021-11-01 NOTE — Telephone Encounter (Signed)
From: Marlynn Perking  To: Dr. [place] J Grnak  Sent: 11/01/2021 3:24 PM EST  Subject: BP Meds refill    I need refills on my blood pressure meds please.   Hydrochlorothiszide and valsartan. Can you call them in before you take off for Thanksgiving please? Thank you!    Melane Windholz  [email or web address]  [phone]

## 2021-11-01 NOTE — Telephone Encounter (Signed)
As of last office note (03/29/2021), patient is still taking this medication, however patient does not have a follow up appointment scheduled, a messaged as been sent to the patient to recommend scheduling. Pended medication has correct quantity and pended to preferred pharmacy.    Request submitted to Dr.William gasque (On Call Physician) in absence of Dr.Jordan Grnak (PCP).

## 2021-11-01 NOTE — Progress Notes (Signed)
Patient:  Karen Logan  Age:  40 y.o.  DOB:  03/13/1981     SEX:  female MRN:  [id number]     RACE: American [place] / [place] Native     SEEN:    PATIENT    SPOUSE   OTHER:              PHQ-9  11/01/2021 08/04/2021 05/17/2021   Little interest or pleasure in doing things 2 0 1   Little interest or pleasure in doing things - - -   Feeling down, depressed, or hopeless 1 0 1   Trouble falling or staying asleep, or sleeping too much Trouble falling or staying asleep, or sleeping too much - - -   Feeling tired or having little energy Feeling tired or having little energy - - -   Poor appetite or overeating Poor appetite, weight loss, or overeating - - -   Feeling bad about yourself - or that you are a failure or have let yourself or your family down 3 0 3   Feeling bad about yourself - or that you are a failure or have let yourself or your family down - - -   Trouble concentrating on things, such as reading the newspaper or watching television Trouble concentrating on things such as school, work, reading, or watching TV - - -   Moving or speaking so slowly that other people could have noticed. Or the opposite - being so fidgety or restless that you have been moving around a lot more than usual Moving or speaking so slowly that other people could have noticed; or the opposite being so fidgety that others notice - - -   Thoughts that you would be better off dead, or of hurting yourself in some way 0 0 0   Thoughts of being better off dead, or hurting yourself in some way - - -   PHQ-2 Score 3 0 2   Total Score PHQ 2 - - -   PHQ-9 Total Score PHQ 9 Score - - -   If you checked off any problems, how difficult have these problems made it for you to do your work, take care of things at home, or get along with other people? How difficult have these problems made it for you to do your work, take care of your home and get along with others - - -        GAD-7 SCREENING 11/01/2021 08/04/2021 02/01/2021   Feeling nervous, anxious, or on edge Nearly every day Nearly every day -   Not being able to stop or control worrying Nearly every day Nearly every day -   Worrying too much about different things Nearly every day Several days -   Trouble relaxing More than half the days Several days -   Being so restless that it is hard to sit still More than half the days Several days -   Becoming easily annoyed or irritable Not at all Not at all -   Feeling afraid as if something awful might happen Nearly every day Not at all -   GAD-7 Total Score 16 9 -   How difficult have these problems made it for you to do your work, take care of things at home, or get  along with other people? Somewhat difficult Somewhat difficult Very difficult   Feeling nervous, anxious, or on edge - - More than half the days   GAD-7 Total Score - - 17        I was in the office while conducting this encounter.    Consent:  She and/or her healthcare decision maker is aware that this patient-initiated Telehealth encounter is a billable service, with coverage as determined by her insurance carrier. She is aware that she may receive a bill and has provided verbal consent to proceed: YesPatient identification was verified, and a caregiver was present when appropriate. The patient was located in a state where the provider was credentialed to provide care.    This virtual visit was conducted via MyChart. Pursuant to the emergency declaration under the [facility] Act and the [employer], 1135 waiver authority and the [employer] Act, this Virtual  Visit was conducted to reduce the patient's risk of exposure to COVID-19 and provide continuity of care for an established patient. Services were provided through a video synchronous discussion virtually to substitute for in-person clinic visit.  Due to this being a TeleHealth evaluation, many elements  of the physical examination are unable to be assessed.     Total Time: minutes: 11-20 minutes.    Chief complaint:  Pt says doing good on the current medications.    Subjective:  Seen virtually for follow-up.  States doing well.  Work has been busy and keeps her mind distracted.  She just got over a bad sinus infection and her husband is getting it now.  She is Psychiatric nurse at [employer].  Now has a Bathini and feels 100% better.  When asked about her PHQ-9 questionnaire she sees that she is feeling her best but when she worries about some stuff it causes her to be anxious and then the depression kicks in.  Feels not good enough.  Medications help with feeling good in her head.  States she is 2 more steps behind to climb to be at the top of the ladder at work.  Home life is good.  She has been getting out more and is able to go to the mall by herself now.  Talks about seeing Mr. Rosana Berger who gave her a lot of homework and helped her.  Wants to see a female therapist.  Supportive psychotherapy provided.  Patient denies suicidal ideations or homicidal ideations.  Denies symptoms psychosis.  She wants to continue her current medications at the current doses as she states she is doing 100% better on them.    Patient Active Problem List   Diagnosis    Depression    Hypertension    Class 3 severe obesity without serious comorbidity with body mass index (BMI) of 40.0 to 44.9 in adult [facility])    Tinnitus of both ears    Intractable migraine without aura and without status migrainosus     LMP11/14/22, Birth control,   Not pregnant    Denies palpitation,SOB, Chest pain, headaches. In no acute distress.       MEDICATION REVIEW:    Current Medications:    Current Outpatient Medications   Medication Sig    Prenatal Vit-Fe Fumarate-FA (PRENATAL PO) Take by mouth    buPROPion (WELLBUTRIN SR) 100 MG extended release tablet Take 1 tablet by mouth every morning    clonazePAM (KLONOPIN) 1 MG tablet Take 0.5 tablets by mouth  2 times daily as  needed for Anxiety for up to 120 days.    sertraline (ZOLOFT) 100 MG tablet Take 2 tablets by mouth every morning    vitamin D3 (CHOLECALCIFEROL) 125 MCG (5000 UT) TABS tablet Take by mouth daily    Melatonin 5 MG CAPS Take by mouth    Rimegepant Sulfate (NURTEC) 75 MG TBDP Take 75 mg by mouth once as needed    valsartan (DIOVAN) 40 MG tablet Take 1 tablet by mouth daily    hydroCHLOROthiazide (HYDRODIURIL) 25 MG tablet Take 0.5 tablets by mouth daily    methylfolate (DEPLIN) 7.5 MG TABS tablet Take 7.5 tablets by mouth daily (Patient not taking: No sig reported)    acetaZOLAMIDE (DIAMOX) 500 MG extended release capsule Take 500 mg by mouth 2 times daily (Patient not taking: No sig reported)    promethazine (PHENERGAN) 12.5 MG tablet Take 12.5 mg by mouth every 8 hours as needed (Patient not taking: No sig reported)     No current facility-administered medications for this visit.       Allergies   Allergen Reactions    Levonorgestrel-Ethinyl Estrad Itching    Hydrocodone-Acetaminophen Nausea Only and Swelling       Past Medical History, Past Surgical History, Family history, Social History, and Medications were all reviewed with the patient today and updated as necessary.     Compliant with medication: Yes   Side effects from medications:  No     EXAMINATION  Musculoskeletal    GAIT AND STATION   [x]  WNL   []  RESTRICTED   []  UNSTEADY WALK        []  ABNORMAL   []  UNBALANCED         PSYCHIATRIC     GENERAL APPEARANCE:   [x]   WELL GROOMED []      DISHEVELED   []   UNKEMPT      []   UNUSUAL/BIZZARE    []  WNL       ATTITUDE:   [x]  COOPERATIVE   []  GUARDED   []  SUSPICIOUS      []  HOSTILE                            BEHAVIOR:   [x]  CALM   []  HYPERACTIVE   []  MANNERISMS      []  BIZZARE         SPEECH:   [x]  NORMAL FOR CLIENT   []  SPONTANEOUS   []  SLURRED   []  WHISPERING      []  LOUD   []  PRESSURED   []  ARTICULATE       EYE CONTACT:   [x]  WNL   []  BLANK STARE   []  INTENSE      []  AVOIDANT         MOOD:   []   EUTHYMIC   [x]  ANXIOUS   [x]  DEPRESSED      []  IRRITABLE   []  ANGRY   []  APATHETIC     AFFECT:   [x]  CONGRUENT WITH MOOD   []  FLAT   []  CONSTRICTED      []  INAPPROPRIATE   []  LABILE           THOUGHT PROCESS:   [x]  LOGICAL/GOAL-DIRECTED   []  FOI   []  CIRCUMSANTIAL      []  INCOHERENT   []  TANGENTIAL   []  CONCRETE      []  PERSEVERATION           THOUGHT CONTENT:  DELUSIONS   DENIES   GRANDIOSE   PERSECUTORY   RELIGIOUS   REFERENCE   HALLUCINATIONS   DENIES   AUDITORY   VISUAL   OLFACTORY   TACTILE      GUSTATORY   SOMATIC         OBSESSIONS   DENIES   PRESENT         SUICIDAL IDEATION   DENIES   PRESENT W/O PLAN   PRESENT W/ PLAN       HOMICIDAL IDEATION   DENIES   PRESENT W/O PLAN   PRESENT W/ PLAN           JUDGEMENT:    GOOD    FAIR    POOR   INSIGHT:    GOOD    FAIR    POOR     COGNITION:           SENSORIUM:    ALERT    CLOUDED    DROWSY     ORIENTATION:    INTACT    TIME:  PLACE  PERSON   RECENT & REMOTE MEMORY:    NORMAL    OTHER:                  ATTENTION:    INTACT    MILD IMPAIRMENT    SEVERE IMPAIRMENT     CONCENTRATION:    INTACT    MILD IMPAIRMENT    SEVERE IMPAIRMENT     LANGUAGE:    AVERAGE    ABOVE AVERAGE    BELOW AVERAGE     FUND OF KNOWLEDGE:    UNABLE TO ASSESS AT THIS TIME    AVERAGE    ABOVE AVERAGE    BELOW AVERAGE       GOOD TO EXCELLENT KNOWLEDGE OF CURRENT EVENTS    POOR TO NO KNLEDGE OF CURRENT EVENTS           ABNORMAL MOVEMENTS:    NONE    TICS    TREMORS    BIZZARE       FACE    TRUNK    EXTREMETIES    GESTURES        SLEEP:    GOOD    FAIR    POOR      MUSCLE STRENGTH AND TONE    WNL    ATROPHY    SPASTIC         FLACCID    COGWHEEL         Diagnoses/Impressions:    ICD-10-CM    1. Moderate episode of recurrent major depressive disorder (HCC)  F33.1       2. Generalized anxiety disorder with panic attacks  F41.1     F41.0       3.  Primary insomnia  F51.01           TREATMENT GOALS:  Symptom reduction, Medication adherence, maintain therapeutic gains    LABS/IMAGING:      Ordered   Reviewed   New Labs Ordered:     LAB  WBC   Date/Time Value Ref Range Status   02/21/2021 03:22 PM 9.1 4.3 - 11.1 K/uL Final     Hemoglobin   Date/Time Value Ref Range Status   02/21/2021 03:22 PM 12.1 11.7 - 15.4 g/dL Final     Hematocrit   Date/Time Value Ref Range Status   02/21/2021 03:22 PM 37.8 35.8 - 46.3 % Final     Platelets   Date/Time Value  Ref Range Status   02/21/2021 03:22 PM 323 150 - 450 K/uL Final     Sodium   Date/Time Value Ref Range Status   02/21/2021 03:22 PM 141 136 - 145 mmol/L Final     Potassium   Date/Time Value Ref Range Status   02/21/2021 03:22 PM 3.7 3.5 - 5.1 mmol/L Final     Chloride   Date/Time Value Ref Range Status   02/21/2021 03:22 PM 106 98 - 107 mmol/L Final     CO2   Date/Time Value Ref Range Status   02/21/2021 03:22 PM 29 21 - 32 mmol/L Final     BUN   Date/Time Value Ref Range Status   02/21/2021 03:22 PM 7 6 - 23 MG/DL Final     ALT   Date/Time Value Ref Range Status   03/15/2021 02:19 PM 10 0 - 32 IU/L Final     AST   Date/Time Value Ref Range Status   03/15/2021 02:19 PM 14 0 - 40 IU/L Final     TSH   Date/Time Value Ref Range Status   09/01/2020 03:02 PM 0.818 0.450 - 4.500 uIU/mL Final       Please refer to the lab tab in the epic and care everywhere for the most recent lab results.    Plan:     [x]   Medication ordered: Wellbutrin, Klonopin, Zoloft to target depression, anxiety, insomnia.     [x]   Medication education/counseling provided  Medication dosage and time to take, purpose/expected benefits/risks, common side effects, lab monitoring required and reason, expected length of treatment, risk of no treatment, effects on pregnancy/nursing, financial availability. Educated patient on  side effects/risks/benefits of meds including cardiac arrhythmias, suicidal ideations, orthostatic hypotension, serotonin  syndrome, risk of mania/hypomania from antidepressants, withdrawals from abrupt discontinuation of meds, rrisk of bleeding, risk of seizures, addiction potential, memory impairment with long term use of benzos, respiratory depression, high blood pressure, dizziness, drowsiness, sedation , risk of falls, Risk & benefits discussed: including but not limited to possible off-label medication usage.       [x]  Follow MSE for sxs improvement     I have reviewed the patient???s controlled substance prescription history, as maintained in the [place] prescription monitoring program, so that the prescription(s) for a  controlled substance can be given.    Recommendations and Referrals:    Follow up with : MD, requires monitoring of response to medication, requires monitoring of medication side effects.    Time until next PMA:     Follow up with Mental Health Clinicians: psychotherapy interventions, improve level of functioning, monitoring to prevent decompensation /hospitalization, monitoring to maintain therapeutic gains, symptoms (resolving and controlled)           Psychotherapy note:                                __10_ Minutes of psychotherapy     [x]   Supportive psychotherapy, Patient discussed certain situational and personal stressors ongoing in her life at this time, weight management d/w the patient. Sleep hygiene d/w patient. Patient allowed to vent out her emotions.  Scenarios were reviewed using role playing and CBT techniques in order to increase insight and decrease anxiety.       []   Disposition planning      []   Dangerous and will not contract for safety in the community    **Pateint has been notified: They are to  call 911 or go to their nearest E.R. if they are experiencing a medical emergency or suicidal ideations/homicidal ideations.**  All ancillary documentation entered reviewed by provider.      PLEASE NOTE:  This document has been produced in part or whole using voice recognition software.  Proofread however unrecognized errors in transcription may be present.        Daivd Council, MD

## 2021-11-16 DIAGNOSIS — Z13220 Encounter for screening for lipoid disorders: Secondary | ICD-10-CM | POA: Diagnosis not present

## 2021-11-16 DIAGNOSIS — B353 Tinea pedis: Secondary | ICD-10-CM | POA: Diagnosis not present

## 2021-11-16 DIAGNOSIS — E049 Nontoxic goiter, unspecified: Secondary | ICD-10-CM | POA: Diagnosis not present

## 2021-11-16 DIAGNOSIS — Z136 Encounter for screening for cardiovascular disorders: Secondary | ICD-10-CM | POA: Diagnosis not present

## 2021-11-16 DIAGNOSIS — Z1231 Encounter for screening mammogram for malignant neoplasm of breast: Secondary | ICD-10-CM | POA: Diagnosis not present

## 2021-11-16 DIAGNOSIS — J069 Acute upper respiratory infection, unspecified: Secondary | ICD-10-CM | POA: Diagnosis not present

## 2021-11-16 DIAGNOSIS — Z0000 Encounter for general adult medical examination without abnormal findings: Secondary | ICD-10-CM | POA: Diagnosis not present

## 2021-11-23 ENCOUNTER — Other Ambulatory Visit: Payer: Self-pay

## 2021-12-18 ENCOUNTER — Encounter

## 2021-12-19 ENCOUNTER — Ambulatory Visit: Payer: BC Managed Care – PPO | Admitting: Urology

## 2021-12-19 NOTE — Telephone Encounter (Addendum)
Patient advised that she does not have insurance at all and does pay out of pocket for prescriptions. Would like to know if it is possible to authorize for her medications to all be picked up at the same time to save on trips to the pharmacy.    ----- Message from [name] sent at 12/18/2021  7:17 PM EST -----  Regarding: Refill Timing  Can we refill all my prescriptions on the 20th so I can get them all I at one time? I received a promotion and my hours have increased so it has been hard to get to the pharmacy multiple times a month. I have two other prescriptions through Dr. Reece Agar, who I will message as well.     Thank you!    Karen Logan

## 2021-12-19 NOTE — Telephone Encounter (Signed)
Refills were sent 11/22. Patient will contact pharmacy to have this filled.

## 2021-12-21 NOTE — Telephone Encounter (Signed)
Patient notified. She advised that she has found a pharmacy that is closer to her work and may transfer the prescriptions to the new pharmacy. Will let us know new pharmacy information and if we need to send refills if any of them won't transfer.

## 2021-12-26 ENCOUNTER — Encounter: Attending: Obstetrics & Gynecology | Primary: Internal Medicine

## 2021-12-29 ENCOUNTER — Ambulatory Visit: Admit: 2021-12-29 | Discharge: 2022-02-01 | Primary: Internal Medicine

## 2021-12-29 ENCOUNTER — Ambulatory Visit: Admit: 2021-12-29 | Discharge: 2021-12-29 | Attending: Internal Medicine | Primary: Internal Medicine

## 2021-12-29 DIAGNOSIS — M25562 Pain in left knee: Secondary | ICD-10-CM

## 2021-12-29 NOTE — Progress Notes (Signed)
[place] Angeli Demilio, D.O.   [facility]  [address]  [place], [place] [address]  Tel: [phone]    Office Visit: Follow Up     Patient Name: Karen Logan   DOB:  1981-07-14   MRN:   [id number]      Today's Date: 12/29/21 8:48 AM    Subjective     The patient is a 41 y.o.-year-old female who presents for follow-up.    Today:   She states that she feels like she popped her left knee out of place on Monday while she was walking with instant pain. The pain was sharp and felt like someone was pinching a nerve inside the knee. She states it was painful after and it hurts to walk on it with all her weight on. She did not have any falls of trauma. She states that she thinks it popped back in place last night.  But she is still having some pain and stiffness in the knee. She states when she puts weight on her right knee it feels like it wants to bend the wrong way. She denies any numbness or tingling in her toes.     She has only tried biofreeze and rest with elevation which helps.     Review of Systems   Constitutional: Negative.    HENT: Negative.     Eyes: Negative.    Respiratory: Negative.     Cardiovascular: Negative.    Gastrointestinal: Negative.    Endocrine: Negative.    Genitourinary: Negative.    Musculoskeletal:  Positive for arthralgias.   Neurological: Negative.    Psychiatric/Behavioral: Negative.        Past Medical History:   Diagnosis Date    Allergies     Anxiety     Bilateral calf pain 09/01/2020    Bladder problem     Carpal tunnel syndrome     Class 3 severe obesity without serious comorbidity with body mass index (BMI) of 40.0 to 44.9 in adult [facility]) 09/01/2020    Depression     Encounter to establish care 09/01/2020    Headache     Hypertension     Intractable migraine without aura and without status migrainosus 03/15/2021    Panic attack 1997    Tinnitus of both ears 09/01/2020     Social History     Socioeconomic History    Marital status: Married     Spouse name: Not on file     Number of children: Not on file    Years of education: Not on file    Highest education level: Not on file   Occupational History    Not on file   Tobacco Use    Smoking status: Never    Smokeless tobacco: Never   Substance and Sexual Activity    Alcohol use: Never    Drug use: Never    Sexual activity: Yes     Partners: Male     Comment: My husband   Other Topics Concern    Not on file   Social History Narrative    Not on file     Social Determinants of Health     Financial Resource Strain: Not on file   Food Insecurity: Not on file   Transportation Needs: Not on file   Physical Activity: Not on file   Stress: Not on file   Social Connections: Not on file   Intimate Partner Violence: Not on file   Housing  Stability: Not on file         Current Outpatient Medications   Medication Sig    valsartan (DIOVAN) 40 MG tablet Take 1 tablet by mouth daily    hydroCHLOROthiazide (HYDRODIURIL) 25 MG tablet Take 0.5 tablets by mouth daily    buPROPion (WELLBUTRIN SR) 100 MG extended release tablet Take 1 tablet by mouth every morning    clonazePAM (KLONOPIN) 1 MG tablet Take 0.5 tablets by mouth 2 times daily as needed for Anxiety for up to 120 days.    sertraline (ZOLOFT) 100 MG tablet Take 2 tablets by mouth every morning    Prenatal Vit-Fe Fumarate-FA (PRENATAL PO) Take by mouth    vitamin D3 (CHOLECALCIFEROL) 125 MCG (5000 UT) TABS tablet Take by mouth daily    Melatonin 5 MG CAPS Take by mouth    methylfolate (DEPLIN) 7.5 MG TABS tablet Take 7.5 tablets by mouth daily (Patient not taking: No sig reported)    acetaZOLAMIDE (DIAMOX) 500 MG extended release capsule Take 500 mg by mouth 2 times daily (Patient not taking: No sig reported)    promethazine (PHENERGAN) 12.5 MG tablet Take 12.5 mg by mouth every 8 hours as needed (Patient not taking: No sig reported)    Rimegepant Sulfate (NURTEC) 75 MG TBDP Take 75 mg by mouth once as needed (Patient not taking: Reported on 12/29/2021)     No current facility-administered  medications for this visit.        Objective     Vitals:    12/29/21 0840   BP: 132/71   Site: Left Upper Arm   Position: Sitting   Cuff Size: Large Adult   Pulse: 68   Resp: 12   Temp: 98 ??F (36.7 ??C)   TempSrc: Temporal   SpO2: 98%   Weight: 258 lb 12.8 oz (117.4 kg)   Height: 5\' 4"  (1.626 m)      Physical Exam:  Constitutional: Appears well kempt. Alert/oriented x3. In no acute distress.  Head: Normocephalic No trauma. No deformity.   Cardiac: Heart with normal rate/rhythm. No murmurs. No gallops. Pulses normal.   Pulmonary: Lungs clear to auscultation bilaterally. In no respiratory distress. No wheezing. No rales. No rhonci.   Musculoskeletal left knee: Moderate pain on the medial and lateral joint lines with palpation.  No effusion appreciated.  Negative apprehension test.  Pain with McMurray's.  Negative Lachman's.  Full range of motion.  No laxity to varus and valgus stress testing.  Psychiatric: Normal thought content. Normal behavior. Normal judgment.     Recommendations     Assessment:  Patient Active Problem List   Diagnosis    Depression    Hypertension    Class 3 severe obesity without serious comorbidity with body mass index (BMI) of 40.0 to 44.9 in adult [facility])    Tinnitus of both ears    Intractable migraine without aura and without status migrainosus      Status of above conditions: stable     Plan:  -Left knee xray   -Continue Biofreeze and rest as needed, patient states that this helps with her pain  -Referral to Orthopedic Surgery; referral confirmed for Monday, 01/01/2022 at 2:30 PM with Dr. 01/03/2022.  -Weightbearing as tolerated, patient states that today is a better day for her walking wise, she has been able to ambulate since the initial event with a mild limp.    -Previous medical records and/or labs/tests available to me reviewed, any records outstanding not available requested  -The  risks, benefits, and medical necessity of all medications, tests labs, and any other orders that were ordered at  today's visit were discussed with the patient including all elective or patient requested orders.  Decision to order or not order tests or based on this risk/benefit ratio and medical necessity.  -The patient expresses understanding of the plan as I've explained it to her and is in agreement with the current plan.    Follow Up:     Total Time: 30 minutes (chart reviewing and in-exam time)    Signed: [place] Kennis Wissmann, D.O.  12/29/21  8:48 AM

## 2021-12-29 NOTE — Telephone Encounter (Signed)
Patient stated that 11/16/21 she filled Cymbalta 60 one in the morning but is now out of refills. Stated that she had refills left previously and that is what was being filled. Would like to know if she is supposed to be taking this. If so, requesting refills to be sent. If not, she is requesting the taper dose.   [facility].

## 2021-12-29 NOTE — Telephone Encounter (Signed)
-----   Message from [name] sent at 12/29/2021 12:53 PM EST -----  Regarding: Cymbalta  The pharmacy is stating I am out of refills of my cymbalta.    Karen Logan

## 2022-01-01 MED ORDER — DULOXETINE HCL 60 MG PO CPEP
60 MG | ORAL_CAPSULE | Freq: Every day | ORAL | 3 refills | Status: DC
Start: 2022-01-01 — End: 2022-02-22

## 2022-01-02 ENCOUNTER — Encounter: Attending: Orthopaedic Surgery | Primary: Internal Medicine

## 2022-01-02 DIAGNOSIS — M25562 Pain in left knee: Secondary | ICD-10-CM

## 2022-01-02 NOTE — Telephone Encounter (Signed)
Patient has been notified. She advised that she is taking the Zoloft as well and is currently doing good.

## 2022-01-02 NOTE — Telephone Encounter (Deleted)
Patient  would like to pick up her work excuse, when it is ready.

## 2022-01-05 ENCOUNTER — Encounter: Admit: 2022-01-05 | Discharge: 2022-01-05 | Attending: Internal Medicine | Primary: Internal Medicine

## 2022-01-05 DIAGNOSIS — I10 Essential (primary) hypertension: Secondary | ICD-10-CM

## 2022-01-05 MED ORDER — HYDROCHLOROTHIAZIDE 25 MG PO TABS
25 MG | ORAL_TABLET | Freq: Every day | ORAL | 0 refills | Status: AC
Start: 2022-01-05 — End: 2022-04-05

## 2022-01-05 MED ORDER — SERTRALINE HCL 100 MG PO TABS
100 MG | ORAL_TABLET | Freq: Every morning | ORAL | 2 refills | Status: DC
Start: 2022-01-05 — End: 2022-02-22

## 2022-01-05 NOTE — Progress Notes (Signed)
Karen Logan, D.O.   [facility]  [address]  [place], [place] [id number]  Tel: [phone]    Office Visit: Follow Up     Patient Name: Karen PerkingSarah Beth Shishido   DOB:  August 21, 1981   MRN:   [id number]      Today's Date: 01/05/22 2:35 PM    Subjective     The patient is a 41 y.o.-year-old female who presents for follow-up.    Left knee pain  -Patient missed her original orthopedic appointment due to her job, she did reschedule  -She states her knees only hurt when she is barefoot at home walking around    Hypertension  -Blood pressure stable today  -Patient on hydrochlorothiazide 25 mg and Diovan 40 mg    Today:   She states her knee is better, but only hurts when she walks barefoot on her hardwood floors. She states it is not hurting today when she is walking around on her heels. She states she still has some instability/feeling of her kneecap.     Review of Systems   Constitutional: Negative.    HENT: Negative.     Respiratory: Negative.     Cardiovascular: Negative.    Gastrointestinal: Negative.    Genitourinary: Negative.    Musculoskeletal:         Left knee pain   Skin: Negative.    Neurological: Negative.    Psychiatric/Behavioral: Negative.        Past Medical History:   Diagnosis Date    Allergies     Anxiety     Bilateral calf pain 09/01/2020    Bladder problem     Carpal tunnel syndrome     Class 3 severe obesity without serious comorbidity with body mass index (BMI) of 40.0 to 44.9 in adult [facility](HCC) 09/01/2020    Depression     Encounter to establish care 09/01/2020    Headache     Hypertension     Intractable migraine without aura and without status migrainosus 03/15/2021    Panic attack 1997    Tinnitus of both ears 09/01/2020     Social History     Socioeconomic History    Marital status: Married     Spouse name: Not on file    Number of children: Not on file    Years of education: Not on file    Highest education level: Not on file   Occupational History    Not on file   Tobacco Use     Smoking status: Never    Smokeless tobacco: Never   Substance and Sexual Activity    Alcohol use: Never    Drug use: Never    Sexual activity: Yes     Partners: Male     Comment: My husband   Other Topics Concern    Not on file   Social History Narrative    Not on file     Social Determinants of Health     Financial Resource Strain: Not on file   Food Insecurity: Not on file   Transportation Needs: Not on file   Physical Activity: Not on file   Stress: Not on file   Social Connections: Not on file   Intimate Partner Violence: Not on file   Housing Stability: Not on file         Current Outpatient Medications   Medication Sig    DULoxetine (CYMBALTA) 60 MG extended release capsule Take 1 capsule by mouth daily  valsartan (DIOVAN) 40 MG tablet Take 1 tablet by mouth daily    hydroCHLOROthiazide (HYDRODIURIL) 25 MG tablet Take 0.5 tablets by mouth daily    buPROPion (WELLBUTRIN SR) 100 MG extended release tablet Take 1 tablet by mouth every morning    clonazePAM (KLONOPIN) 1 MG tablet Take 0.5 tablets by mouth 2 times daily as needed for Anxiety for up to 120 days.    sertraline (ZOLOFT) 100 MG tablet Take 2 tablets by mouth every morning    Prenatal Vit-Fe Fumarate-FA (PRENATAL PO) Take by mouth    methylfolate (DEPLIN) 7.5 MG TABS tablet Take 7.5 tablets by mouth daily    vitamin D3 (CHOLECALCIFEROL) 125 MCG (5000 UT) TABS tablet Take by mouth daily    Melatonin 5 MG CAPS Take by mouth    acetaZOLAMIDE (DIAMOX) 500 MG extended release capsule Take 500 mg by mouth 2 times daily (Patient not taking: Reported on 01/05/2022)    promethazine (PHENERGAN) 12.5 MG tablet Take 12.5 mg by mouth every 8 hours as needed (Patient not taking: No sig reported)    Rimegepant Sulfate (NURTEC) 75 MG TBDP Take 75 mg by mouth once as needed (Patient not taking: No sig reported)     No current facility-administered medications for this visit.      Objective     Vitals:    01/05/22 1431   BP: 139/73   Site: Left Upper Arm   Position:  Sitting   Cuff Size: Large Adult   Pulse: 78   Resp: 14   Temp: 97.8 ??F (36.6 ??C)   TempSrc: Temporal   SpO2: 97%   Weight: 256 lb 9.6 oz (116.4 kg)   Height: 5\' 4"  (1.626 m)      Physical Exam:  Constitutional: Appears well kempt. Alert/oriented x3. In no acute distress.  Head: Normocephalic No trauma. No deformity.   Cardiac: Heart with normal rate/rhythm. No murmurs. No gallops. Pulses normal.   Pulmonary: Lungs clear to auscultation bilaterally. In no respiratory distress. No wheezing. No rales. No rhonci.   Psychiatric: Normal thought content. Normal behavior. Normal judgment.     Recommendations     Assessment:  Patient Active Problem List   Diagnosis    Depression    Hypertension    Class 3 severe obesity without serious comorbidity with body mass index (BMI) of 40.0 to 44.9 in adult [facility])    Tinnitus of both ears    Intractable migraine without aura and without status migrainosus      Status of above conditions: stable     Plan:  Acute left knee pain  -Seems to have mostly resolved, patient still complains of pain when walking on her hardwood floors barefoot.  She still states that it feels like it is going to pop out every once in a while.  -I suspect that she subluxed her knee on initial injury causing residual pain that has gotten better  -She has an appointment with [facility] to assess need for MRI and further treatment    Hypertension  -Patient states she is unhappy with her valsartan, she thinks it is making her break out in a sweat whenever she goes anywhere she would like to stop this  -We will increase her hydrochlorothiazide to 25 mg from 12.5 mg and discontinue her valsartan and follow-up in 2 weeks to check her blood pressure.  She will also log her blood pressures over the next 2 weeks so we can get an idea of how her average blood  pressures are at home and see if she needs another medication in addition to her hydrochlorothiazide 25 mg daily    Anxiety and depression  -Follow-up with Dr.  Sharol Given  -Refill of her Zoloft  -Continue Cymbalta 60 mg and Wellbutrin 100 mg and Klonopin as needed    -Previous medical records and/or labs/tests available to me reviewed, any records outstanding not available requested  -The risks, benefits, and medical necessity of all medications, tests labs, and any other orders that were ordered at today's visit were discussed with the patient including all elective or patient requested orders.  Decision to order or not order tests or based on this risk/benefit ratio and medical necessity.  -The patient expresses understanding of the plan as I've explained it to her and is in agreement with the current plan.    Follow Up:     Total Time: 30 minutes (chart reviewing and in-exam time)    Signed: [place] Margery Szostak, D.O.  01/05/22  2:35 PM

## 2022-01-19 ENCOUNTER — Encounter: Attending: Obstetrics & Gynecology | Primary: Internal Medicine

## 2022-01-23 ENCOUNTER — Ambulatory Visit: Admit: 2022-01-23 | Discharge: 2022-01-31 | Primary: Internal Medicine

## 2022-01-23 ENCOUNTER — Encounter: Attending: Internal Medicine | Primary: Internal Medicine

## 2022-01-23 ENCOUNTER — Ambulatory Visit: Admit: 2022-01-23 | Discharge: 2022-01-23 | Attending: Orthopaedic Surgery | Primary: Internal Medicine

## 2022-01-23 DIAGNOSIS — G8929 Other chronic pain: Secondary | ICD-10-CM

## 2022-01-23 DIAGNOSIS — M25562 Pain in left knee: Secondary | ICD-10-CM

## 2022-01-23 MED ORDER — DICLOFENAC SODIUM 1 % EX GEL
1 % | Freq: Two times a day (BID) | CUTANEOUS | 2 refills | Status: AC | PRN
Start: 2022-01-23 — End: 2022-05-22

## 2022-01-23 MED ORDER — DICLOFENAC SODIUM 75 MG PO TBEC
75 MG | ORAL_TABLET | Freq: Two times a day (BID) | ORAL | 0 refills | Status: AC
Start: 2022-01-23 — End: 2022-05-22

## 2022-01-23 NOTE — Progress Notes (Unsigned)
[place] Xzayvier Fagin, D.O.   [facility]  [address]  [place], [place] [address]  Tel: [phone]    Office Visit: Follow Up     Patient Name: Karen Logan   DOB:  [birth year]   MRN:   [id number]      Today's Date: 01/23/22 3:15 PM    Subjective     The patient is a 41 y.o.-year-old female who presents for follow-up.    Acute left knee pain  -Seems to have mostly resolved, patient still complains of pain when walking on her hardwood floors barefoot.  She still states that it feels like it is going to pop out every once in a while.  -I suspect that she subluxed her knee on initial injury causing residual pain that has gotten better  -She has an appointment with Dr. Melanee Left to assess need for MRI and further treatment     Hypertension  -Patient states she is unhappy with her valsartan, she thinks it is making her break out in a sweat whenever she goes anywhere she would like to stop this  -We will increase her hydrochlorothiazide to 25 mg from 12.5 mg and discontinue her valsartan and follow-up in 2 weeks to check her blood pressure.  She will also log her blood pressures over the next 2 weeks so we can get an idea of how her average blood pressures are at home and see if she needs another medication in addition to her hydrochlorothiazide 25 mg daily     Anxiety and depression  -Follow-up with Dr. Sharol Given  -Refill of her Zoloft  -Continue Cymbalta 60 mg and Wellbutrin 100 mg and Klonopin as needed    Review of Systems     Past Medical History:   Diagnosis Date    Allergies     Anxiety     Bilateral calf pain 09/01/2020    Bladder problem     Carpal tunnel syndrome     Class 3 severe obesity without serious comorbidity with body mass index (BMI) of 40.0 to 44.9 in adult [facility]) 09/01/2020    Depression     Encounter to establish care 09/01/2020    Headache     Hypertension     Intractable migraine without aura and without status migrainosus 03/15/2021    Panic attack 1997    Tinnitus of both ears  09/01/2020     Social History     Socioeconomic History    Marital status: Married     Spouse name: Not on file    Number of children: Not on file    Years of education: Not on file    Highest education level: Not on file   Occupational History    Not on file   Tobacco Use    Smoking status: Never    Smokeless tobacco: Never   Substance and Sexual Activity    Alcohol use: Never    Drug use: Never    Sexual activity: Yes     Partners: Male     Comment: My husband   Other Topics Concern    Not on file   Social History Narrative    Not on file     Social Determinants of Health     Financial Resource Strain: Not on file   Food Insecurity: Not on file   Transportation Needs: Not on file   Physical Activity: Not on file   Stress: Not on file   Social Connections: Not on file  Intimate Partner Violence: Not on file   Housing Stability: Not on file         Current Outpatient Medications   Medication Sig    hydroCHLOROthiazide (HYDRODIURIL) 25 MG tablet Take 1 tablet by mouth daily    sertraline (ZOLOFT) 100 MG tablet Take 2 tablets by mouth every morning    DULoxetine (CYMBALTA) 60 MG extended release capsule Take 1 capsule by mouth daily    buPROPion (WELLBUTRIN SR) 100 MG extended release tablet Take 1 tablet by mouth every morning    clonazePAM (KLONOPIN) 1 MG tablet Take 0.5 tablets by mouth 2 times daily as needed for Anxiety for up to 120 days.    Prenatal Vit-Fe Fumarate-FA (PRENATAL PO) Take by mouth    methylfolate (DEPLIN) 7.5 MG TABS tablet Take 7.5 tablets by mouth daily    vitamin D3 (CHOLECALCIFEROL) 125 MCG (5000 UT) TABS tablet Take by mouth daily    Melatonin 5 MG CAPS Take by mouth    promethazine (PHENERGAN) 12.5 MG tablet Take 12.5 mg by mouth every 8 hours as needed (Patient not taking: No sig reported)    Rimegepant Sulfate (NURTEC) 75 MG TBDP Take 75 mg by mouth once as needed (Patient not taking: No sig reported)     No current facility-administered medications for this visit.        Objective      There were no vitals filed for this visit.     Physical Exam:  Constitutional: Appears well kempt. Alert/oriented x3. In no acute distress.  Head: Normocephalic No trauma. No deformity.   Cardiac: Heart with normal rate/rhythm. No murmurs. No gallops. Pulses normal.   Pulmonary: Lungs clear to auscultation bilaterally. In no respiratory distress. No wheezing. No rales. No rhonci.   Psychiatric: Normal thought content. Normal behavior. Normal judgment.     Recommendations     Assessment:  Patient Active Problem List   Diagnosis    Depression    Hypertension    Class 3 severe obesity without serious comorbidity with body mass index (BMI) of 40.0 to 44.9 in adult [facility])    Tinnitus of both ears    Intractable migraine without aura and without status migrainosus        Status of above conditions:     Plan:  -    -Previous medical records and/or labs/tests available to me reviewed, any records outstanding not available requested  -The risks, benefits, and medical necessity of all medications, tests labs, and any other orders that were ordered at today's visit were discussed with the patient including all elective or patient requested orders.  Decision to order or not order tests or based on this risk/benefit ratio and medical necessity.  -The patient expresses understanding of the plan as I've explained it to her and is in agreement with the current plan.    Follow Up:     Total Time: 30 minutes (chart reviewing and in-exam time)    Signed: [place] Trayon Krantz, D.O.  01/23/22  3:15 PM

## 2022-01-23 NOTE — Progress Notes (Signed)
Name: Karen Logan  Date of Birth: March 18, 1981  Gender: female  MRN: [id number]      What: Bilateral knee pain left greater than right, right foot pain  How: Insidious onset but made worse by a fall  When: Several months duration    Referring provider: Dr.Grnak    HPI: Karen Logan is a 41 y.o. female seen at the request of Dr.Grnak bilateral knee pain left greater than right.  She has a history of hypertension and obesity with a BMI of 40.  She has noted the onset of bilateral knee pain left greater than right.  She is also fallen which made it worse.  Her right foot hurts more than her knees.  Her right foot is aggravating her back.  She works for the [employer] wears high heels which aggravate her knees and her foot.  She has had no treatment.      ROS/Meds/PSH/PMH/FH/SH: A ten system review of systems was performed and is negative other than what is in the HPI.   Tobacco:  reports that she has never smoked. She has never used smokeless tobacco.  Ht 5\' 5"  (1.651 m)    Wt 240 lb (108.9 kg)    BMI 39.94 kg/m??      Physical Examination:  She is an awake alert female ambulating without difficulty  She is overweight    The right knee has a range of motion of 0 to 135 degrees  negative Lachman,  negative anterior drawer,   negative posterior drawer  negative pivot.   Good tibial step-off,   No varus or valgus laxity at 0 or 30 degrees.   Negative lateral joint line tenderness   negative lateral McMurray.   Negative medial joint line tenderness  negative medial McMurray.   No evidence of any posterolateral instability.   Mild patellofemoral pain.   Negative compression,   negative apprehension  no effusion.   Calves Are non-tender,  neurovascularly patient is intact.   Negative Homans.   MPFL is non-tender.   Patient Can fully extend the knee.   Good quad tone  No erythema.   Negative Dial test.        The left knee has a range of motion of 0 to 135 degrees  negative Lachman,  negative anterior drawer,    negative posterior drawer  negative pivot.   Good tibial step-off,   No varus or valgus laxity at 0 or 30 degrees.   Negative lateral joint line tenderness   negative lateral McMurray.   Negative medial joint line tenderness  negative medial McMurray.   No evidence of any posterolateral instability.   Mild patellofemoral pain.   Negative compression,   negative apprehension  no effusion.   Calves Are non-tender,  neurovascularly patient is intact.   Negative Homans.   MPFL is non-tender.   Patient Can fully extend the knee.   Good quad tone  No erythema.   Negative Dial test.        She has bilateral pes cavus  Left foot is nontender  Right foot is tender  She is neurovascular intact bilaterally    Data Reviewed:          XR: AP standing PA 45 degree weightbearing lateral and sunrise views both knees   Clinical Indication    ICD-10-CM    1. Chronic pain of left knee  M25.562 XR Knee Bilateral Standard    G89.29 Amb External Referral To Physical Therapy  2. Chronic pain of right knee  M25.561 Amb External Referral To Physical Therapy    G89.29       3. Right foot pain  M79.671          Report: AP standing PA 45 degree weightbearing lateral and sunrise views both knees demonstrate a preserved joint space in all 3 compartments.  No fracture.  No dislocation.    Impression: As above   Orvis Brill, MD                  Impression:   1. Chronic pain of left knee    2. Chronic pain of right knee    3. Right foot pain       Rule out internal derangement both knees  Rule out internal derangement right foot  Bilateral pes cavus  Hypertension  Obesity with a BMI of 40    Plan:   I discussed the problem with the patient.  I discussed nonoperative versus operative intervention including injections.  Specifically I discussed the potential need for arthroscopic left knee intervention.  She has not had a trial of nonoperative treatment yet nor do I think she has meniscal pathology that requires operative intervention.  We  will defer surgery and injections for now.  I gave her prescriptions for Voltaren and Voltaren gel.  We will get her in supervised physical therapy for core and strengthening.  I stressed to her the importance of weight loss and maximizing her physical fitness.  Weight loss is the single biggest thing she can do to improve her knee pain.  I will refer her to Dr. Lutricia Horsfall for an evaluation of her right foot.  I will reassess her progress back in 6 weeks.  If she is no better we would consider possibly an injection versus an MRI of the more symptomatic knee.  I will reassess her progress back in 6 weeks  4 This is an undiagnosed new problem with uncertain prognosis    Follow up: Return in about 6 weeks (around 03/06/2022).             Orvis Brill, MD

## 2022-02-22 ENCOUNTER — Telehealth: Admit: 2022-02-22 | Discharge: 2022-02-22 | Attending: Psychiatry | Primary: Internal Medicine

## 2022-02-22 DIAGNOSIS — F330 Major depressive disorder, recurrent, mild: Secondary | ICD-10-CM

## 2022-02-22 MED ORDER — DULOXETINE HCL 60 MG PO CPEP
60 MG | ORAL_CAPSULE | Freq: Every day | ORAL | 3 refills | Status: AC
Start: 2022-02-22 — End: 2022-05-22

## 2022-02-22 MED ORDER — BUPROPION HCL ER (SR) 100 MG PO TB12
100 MG | ORAL_TABLET | Freq: Every morning | ORAL | 3 refills | Status: DC
Start: 2022-02-22 — End: 2022-08-09

## 2022-02-22 MED ORDER — SERTRALINE HCL 100 MG PO TABS
100 MG | ORAL_TABLET | Freq: Every morning | ORAL | 2 refills | Status: AC
Start: 2022-02-22 — End: 2022-05-22

## 2022-02-22 MED ORDER — CLONAZEPAM 1 MG PO TABS
1 MG | ORAL_TABLET | Freq: Two times a day (BID) | ORAL | 3 refills | Status: AC | PRN
Start: 2022-02-22 — End: 2022-06-22

## 2022-02-22 NOTE — Progress Notes (Signed)
Patient:  Karen Logan  Age:  41 y.o.  DOB:  22-Sep-1981     SEX:  female MRN:  [id number]     RACE: American [place] / [place] Native     SEEN:  [x]   PATIENT  []   SPOUSE []   OTHER:              PHQ-9  02/22/2022 01/05/2022 12/29/2021   Little interest or pleasure in doing things 0 0 0   Little interest or pleasure in doing things - - -   Feeling down, depressed, or hopeless 0 0 0   Trouble falling or staying asleep, or sleeping too much 0 0 0   Trouble falling or staying asleep, or sleeping too much - - -   Feeling tired or having little energy 1 0 0   Feeling tired or having little energy - - -   Poor appetite or overeating 1 0 0   Poor appetite, weight loss, or overeating - - -   Feeling bad about yourself - or that you are a failure or have let yourself or your family down 3 0 0   Feeling bad about yourself - or that you are a failure or have let yourself or your family down - - -   Trouble concentrating on things, such as reading the newspaper or watching television 2 0 0   Trouble concentrating on things such as school, work, reading, or watching TV - - -   Moving or speaking so slowly that other people could have noticed. Or the opposite - being so fidgety or restless that you have been moving around a lot more than usual 2 0 0   Moving or speaking so slowly that other people could have noticed; or the opposite being so fidgety that others notice - - -   Thoughts that you would be better off dead, or of hurting yourself in some way 0 0 0   Thoughts of being better off dead, or hurting yourself in some way - - -   PHQ-2 Score 0 0 0   Total Score PHQ 2 - - -   PHQ-9 Total Score 9 0 0   PHQ 9 Score - - -   If you checked off any problems, how difficult have these problems made it for you to do your work, take care of things at home, or get along with other people? 0 0 0   How difficult have these problems made it for you to do your work, take care of your home and get along with others - - -        GAD-7 SCREENING 02/22/2022 01/05/2022 12/29/2021   Feeling nervous, anxious, or on edge Nearly every day Not at all Not at all   Not being able to stop or control worrying Not at all Not at all Not at all   Worrying too much about different things Not at all Not at all Not at all   Trouble relaxing Not at all Not at all Not at all   Being so restless that it is hard to sit still Not at all Not at all Not at all   Becoming easily annoyed or irritable Not at all Not at all Not at all   Feeling afraid as if something awful might happen Not at all Not at all Not at all   GAD-7 Total Score 3 0 0   How difficult have these problems made it for  you to do your work, take care of things at home, or get along with other people? Somewhat difficult Not difficult at all Not difficult at all   Feeling nervous, anxious, or on edge - - -   GAD-7 Total Score - - -        I was at home while conducting this encounter.    Consent:  She and/or her healthcare decision maker is aware that this patient-initiated Telehealth encounter is a billable service, with coverage as determined by her insurance carrier. She is aware that she may receive a bill and has provided verbal consent to proceed: YesPatient identification was verified, and a caregiver was present when appropriate. The patient was located in a state where the provider was credentialed to provide care.    This virtual visit was conducted via MyChart. Pursuant to the emergency declaration under the Stafford Act and the [employer] Emergencies Act, 1135 waiver authority and the [facility] [employer]onavirus Preparedness and [employer] Supplemental Appropriations Act, this Virtual  Visit was conducted to reduce the patient's risk of exposure to COVID-19 and provide continuity of care for an established patient. Services were provided through a video synchronous discussion virtually to substitute for in-person clinic visit.  Due to this being a TeleHealth evaluation, many elements of the physical  examination are unable to be assessed.     Total Time: minutes: 11-20 minutes.    Chief complaint:  Pt says doing great on medications.    Subjective:    Seen virtually for follow-up.  States doing great.  Meds are really working good for her.  She got a promotion at work.  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  Works 45 hours a week.  Her husband works 100 hours a week.  They get along good.  States she still catches herself thinking that she is not good enough and tries exercises she learned with Mr. Rosana BergerBelton.  Does breathing exercises and can feel it working.  Supportive psychotherapy provided.  Commended on her efforts.  She denies suicidal ideation/homicidal ideations.  Denies symptoms of psychosis.    Patient Active Problem List   Diagnosis    Depression    Hypertension    Class 3 severe obesity without serious comorbidity with body mass index (BMI) of 40.0 to 44.9 in adult [facility](HCC)    Tinnitus of both ears    Intractable migraine without aura and without status migrainosus     LMP 02/14/2022 birth control,   Not pregnant    Denies palpitation,SOB, Chest pain, headaches. In no acute distress.       MEDICATION REVIEW:    Current Medications:    Current Outpatient Medications   Medication Sig    MAGNESIUM PO Take by mouth    DULoxetine (CYMBALTA) 60 MG extended release capsule Take 1 capsule by mouth daily    buPROPion (WELLBUTRIN SR) 100 MG extended release tablet Take 1 tablet by mouth every morning    clonazePAM (KLONOPIN) 1 MG tablet Take 0.5 tablets by mouth 2 times daily as needed for Anxiety for up to 120 days.    sertraline (ZOLOFT) 100 MG tablet Take 2 tablets by mouth every morning    diclofenac (VOLTAREN) 75 MG EC tablet Take 1 tablet by mouth 2 times daily    hydroCHLOROthiazide (HYDRODIURIL) 25 MG tablet Take 1 tablet by mouth daily    Prenatal Vit-Fe Fumarate-FA (PRENATAL PO) Take by mouth    vitamin D3 (CHOLECALCIFEROL) 125 MCG (5000 UT) TABS tablet  Take by mouth daily    Melatonin 5 MG CAPS Take  by mouth    diclofenac sodium (VOLTAREN) 1 % GEL Apply 2 g topically 2 times daily as needed for Pain (Patient not taking: Reported on 02/22/2022)    methylfolate (DEPLIN) 7.5 MG TABS tablet Take 7.5 tablets by mouth daily    promethazine (PHENERGAN) 12.5 MG tablet Take 12.5 mg by mouth every 8 hours as needed (Patient not taking: No sig reported)    Rimegepant Sulfate (NURTEC) 75 MG TBDP Take 75 mg by mouth once as needed (Patient not taking: No sig reported)     No current facility-administered medications for this visit.       Allergies   Allergen Reactions    Levonorgestrel-Ethinyl Estrad Itching     Denies allergy 02/22/22    Hydrocodone-Acetaminophen Nausea Only and Swelling       Past Medical History, Past Surgical History, Family history, Social History, and Medications were all reviewed with the patient today and updated as necessary.     Compliant with medication: Yes   Side effects from medications:  No         EXAMINATION  Musculoskeletal    GAIT AND STATION   [x]  WNL   []  RESTRICTED   []  UNSTEADY WALK        []  ABNORMAL   []  UNBALANCED         PSYCHIATRIC     Aggression:  []  yes  [x]  No    GENERAL APPEARANCE:   [x]   WELL GROOMED []      DISHEVELED   []   UNKEMPT      []   UNUSUAL/BIZZARE    []  WNL       ATTITUDE:   [x]  COOPERATIVE   []  GUARDED   []  SUSPICIOUS      []  HOSTILE                            BEHAVIOR:   [x]  CALM   []  HYPERACTIVE   []  MANNERISMS      []  BIZZARE         SPEECH:   [x]  NORMAL FOR CLIENT   []  SPONTANEOUS   []  SLURRED   []  WHISPERING      []  LOUD   []  PRESSURED   []  ARTICULATE       EYE CONTACT:   [x]  WNL   []  BLANK STARE   []  INTENSE      []  AVOIDANT         MOOD:   [x]  EUTHYMIC   []  ANXIOUS   []  DEPRESSED      []  IRRITABLE   []  ANGRY   []  APATHETIC     AFFECT:   [x]  CONGRUENT WITH MOOD   []  FLAT   []  CONSTRICTED      []  INAPPROPRIATE   []  LABILE           THOUGHT PROCESS:   [x]  LOGICAL/GOAL-DIRECTED   []  FOI   []  CIRCUMSANTIAL      []  INCOHERENT   []  TANGENTIAL   []  CONCRETE      []   PERSEVERATION           THOUGHT CONTENT:                DELUSIONS  [x]  DENIES  []  GRANDIOSE  []  PERSECUTORY  []  RELIGIOUS  []  REFERENCE   HALLUCINATIONS  [x]  DENIES  []  AUDITORY  []  VISUAL  []   OLFACTORY   TACTILE      GUSTATORY   SOMATIC         OBSESSIONS   DENIES   PRESENT         SUICIDAL IDEATION   DENIES   PRESENT W/O PLAN   PRESENT W/ PLAN       HOMICIDAL IDEATION   DENIES   PRESENT W/O PLAN   PRESENT W/ PLAN           JUDGEMENT:    GOOD    FAIR    POOR   INSIGHT:    GOOD    FAIR    POOR     COGNITION:           SENSORIUM:    ALERT    CLOUDED    DROWSY     ORIENTATION:    INTACT    TIME:  PLACE  PERSON   RECENT & REMOTE MEMORY:    NORMAL    OTHER:                  ATTENTION:    INTACT    MILD IMPAIRMENT    SEVERE IMPAIRMENT     CONCENTRATION:    INTACT    MILD IMPAIRMENT    SEVERE IMPAIRMENT     LANGUAGE:    AVERAGE    ABOVE AVERAGE    BELOW AVERAGE     FUND OF KNOWLEDGE:    UNABLE TO ASSESS AT THIS TIME    AVERAGE    ABOVE AVERAGE    BELOW AVERAGE       GOOD TO EXCELLENT KNOWLEDGE OF CURRENT EVENTS    POOR TO NO KNLEDGE OF CURRENT EVENTS           ABNORMAL MOVEMENTS:    NONE    TICS    TREMORS    BIZZARE       FACE    TRUNK    EXTREMETIES    GESTURES        SLEEP:    GOOD    FAIR    POOR      MUSCLE STRENGTH AND TONE    WNL    ATROPHY    SPASTIC         FLACCID    COGWHEEL         Diagnoses/Impressions:    ICD-10-CM    1. Mild episode of recurrent major depressive disorder (HCC)  F33.0 clonazePAM (KLONOPIN) 1 MG tablet      2. Generalized anxiety disorder with panic attacks  F41.1 clonazePAM (KLONOPIN) 1 MG tablet    F41.0       3. Primary insomnia  F51.01 clonazePAM (KLONOPIN) 1 MG tablet          TREATMENT GOALS:  Symptom reduction, Medication adherence, maintain therapeutic gains    LABS/IMAGING:      Ordered   Reviewed   New Labs Ordered:     LAB  WBC   Date/Time Value Ref  Range Status   02/21/2021 03:22 PM 9.1 4.3 - 11.1 K/uL Final     Hemoglobin   Date/Time Value Ref Range Status   02/21/2021 03:22 PM 12.1 11.7 - 15.4 g/dL Final     Hematocrit   Date/Time Value Ref Range Status   02/21/2021 03:22 PM 37.8 35.8 - 46.3 % Final     Platelets   Date/Time Value Ref Range Status   02/21/2021 03:22 PM 323 150 - 450 K/uL Final  Sodium   Date/Time Value Ref Range Status   02/21/2021 03:22 PM 141 136 - 145 mmol/L Final     Potassium   Date/Time Value Ref Range Status   02/21/2021 03:22 PM 3.7 3.5 - 5.1 mmol/L Final     Chloride   Date/Time Value Ref Range Status   02/21/2021 03:22 PM 106 98 - 107 mmol/L Final     CO2   Date/Time Value Ref Range Status   02/21/2021 03:22 PM 29 21 - 32 mmol/L Final     BUN   Date/Time Value Ref Range Status   02/21/2021 03:22 PM 7 6 - 23 MG/DL Final     ALT   Date/Time Value Ref Range Status   03/15/2021 02:19 PM 10 0 - 32 IU/L Final     AST   Date/Time Value Ref Range Status   03/15/2021 02:19 PM 14 0 - 40 IU/L Final     TSH   Date/Time Value Ref Range Status   09/01/2020 03:02 PM 0.818 0.450 - 4.500 uIU/mL Final       Please refer to the lab tab in the epic and care everywhere for the most recent lab results.    Plan:       Medication ordered: Wellbutrin, Klonopin, Cymbalta, Zoloft to target depression, anxiety, insomnia.      Medication education/counseling provided  Medication dosage and time to take, purpose/expected benefits/risks, common side effects, lab monitoring required and reason, expected length of treatment, risk of no treatment, effects on pregnancy/nursing, financial availability discussed. Educated patient on  side effects/risks/benefits of meds including  cardiac arrhythmias, suicidal ideations, orthostatic hypotension, serotonin syndrome, risk of mania/hypomania from antidepressants, withdrawals from abrupt discontinuation of meds, liver toxicity, risk of bleeding, risk of seizures, addiction potential, memory impairment with long  term use of benzos, respiratory depression, high blood pressure, dizziness, drowsiness, sedation , risk of falls, Risk & benefits discussed: including but not limited to possible off-label medication usage.        Follow [facility] for sxs improvement     I have reviewed the patient???s controlled substance prescription history, as maintained in the [place] prescription monitoring program, so that the prescription(s) for a  controlled substance can be given.    Recommendations and Referrals:    Follow up with : MD, requires monitoring of response to medication, requires monitoring of medication side effects.    Time until next PMA:     Follow up with Mental Health Clinicians: psychotherapy interventions, improve level of functioning, monitoring to prevent decompensation /hospitalization, monitoring to maintain therapeutic gains, symptoms (resolving and controlled)           Psychotherapy note:                                __10_ Minutes of psychotherapy       Supportive psychotherapy provided to improve self-esteem, psychological functioning, and adaptive skills. Patient discussed certain situational and personal stressors ongoing in her life at this time, weight management d/w the patient. Sleep hygiene d/w patient. Patient allowed to vent out her emotions.  Scenarios were reviewed using role playing, CBT  techniques, and through exploration and interpretation of the patient???s behavior  in order to increase insight and decrease anxiety.    Dysfunctional cognitions and the resulting problematic behavior addressed and alternate options dicussed.        Disposition planning        Dangerous  and will not contract for safety in the community    **Pateint has been notified: They are to call 911 or go to their nearest E.R. if they are experiencing a medical emergency or suicidal ideations/homicidal ideations.**  All ancillary documentation entered reviewed by provider.      PLEASE NOTE:  This document has been  produced in part or whole using voice recognition software. Proofread however unrecognized errors in transcription may be present.        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

## 2022-03-08 ENCOUNTER — Encounter: Attending: Orthopaedic Surgery | Primary: Internal Medicine

## 2022-03-09 ENCOUNTER — Encounter: Attending: Obstetrics & Gynecology | Primary: Internal Medicine

## 2022-03-28 ENCOUNTER — Ambulatory Visit: Admit: 2022-03-28 | Discharge: 2022-03-28 | Attending: Mental Health | Primary: Internal Medicine

## 2022-03-28 DIAGNOSIS — F411 Generalized anxiety disorder: Secondary | ICD-10-CM
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
## 2022-04-19 ENCOUNTER — Encounter: Attending: Mental Health | Primary: Internal Medicine

## 2022-04-24 ENCOUNTER — Encounter

## 2022-04-24 NOTE — Telephone Encounter (Signed)
As of last office note (01/05/2022), patient is still taking this medication however patient does not have a Hypertension follow up scheduled.Pended medication has correct quantity and pended to preferred pharmacy.

## 2022-04-25 MED ORDER — HYDROCHLOROTHIAZIDE 25 MG PO TABS
25 MG | ORAL_TABLET | ORAL | 0 refills | Status: DC
Start: 2022-04-25 — End: 2022-07-15

## 2022-04-29 ENCOUNTER — Encounter

## 2022-05-01 ENCOUNTER — Ambulatory Visit: Admit: 2022-05-01 | Discharge: 2022-05-01 | Attending: Mental Health | Primary: Internal Medicine

## 2022-05-01 DIAGNOSIS — F4329 Adjustment disorder with other symptoms: Secondary | ICD-10-CM

## 2022-05-01 NOTE — Telephone Encounter (Signed)
Patient is requesting refill for Klonopin. Last prescription was sent

## 2022-05-01 NOTE — Progress Notes (Signed)
INDIVIDUAL THERAPY NOTE  NAME: Karen Logan    DATE: 05/01/2022    TYPE OF SERVICE: Individual Therapy    LOCATION OF SERVICE: [facility]    DURATION: 60 mins    DIAGNOSIS: :    1. Grief reaction with prolonged bereavement    2. Generalized anxiety disorder with panic attacks    3. Recurrent major depressive disorder, in partial remission ([facility])        CHIEF COMPLAINT: depression, anxiety    MENTAL STATUS EXAM:  Pt was appropriately dressed and groomed.  Pt was 0x4. No unusual mannerisms were noted.  No psychotic symptoms displayed by pt.  Pt was cooperative and engaged in the session.  Insight and judgment were intact.  Denied suicidal or homicidal ideation.  Fund of knowledge and attention span are WNL.  Denies developmental or language difficulties.  Mood/Affect: mildly depressed and anxious  Thought Process: linear and coherent    PLAN: CBT, DBT, CPT, Humanistic/Client Centered, ACT, Seeking Safety, Psychodynamic, ERP may be used to address goals.    Summary of Service:  This SW met with the pt face to face in the office.  The pt reports that her husband has been devloping worsening depression for the past 2 years.  His symptoms are continue to grow and he has anxiety now about leaving the house.  He can be irritable and hostile towards her.  This has made her life more uncomfortable and she does not want her depression to relapse.  The CBT triangle was discussed and how thoughts affect feelings and behavior.  Ways to change thoughts was discussed.  The pt has been trying to get her husband to get help.  This SW talked to the pt about letting go of the responsibility of her husband's mental health.  Moving from a supervisory/parental role and more into a supportive consultant role was discussed.  Psycho ed about depression and anxiety was given to th ept so she could share with her husband.      Follow Up: 2 weeks

## 2022-05-15 NOTE — Telephone Encounter (Signed)
Patient will need to contact pharmacy to have most recent prescriptions filled. They have been filling an old prescription. Spoke with patient.

## 2022-05-15 NOTE — Telephone Encounter (Signed)
Patient called regarding medication  zoloft 100 mg the quantity is wrong two pills a day for 30 days,can you please contact patient.    Thank you

## 2022-05-16 NOTE — Telephone Encounter (Signed)
Have spoken with patient already. She has prescription at pharmacy for 2 per day. Patient will contact pharmacy to have this filled.

## 2022-05-22 ENCOUNTER — Telehealth: Admit: 2022-05-22 | Discharge: 2022-05-22 | Attending: Psychiatry | Primary: Internal Medicine

## 2022-05-22 DIAGNOSIS — F3341 Major depressive disorder, recurrent, in partial remission: Secondary | ICD-10-CM

## 2022-05-22 MED ORDER — DULOXETINE HCL 30 MG PO CPEP
30 MG | ORAL_CAPSULE | Freq: Every day | ORAL | 3 refills | Status: DC
Start: 2022-05-22 — End: 2022-08-09

## 2022-05-22 MED ORDER — SERTRALINE HCL 100 MG PO TABS
100 MG | ORAL_TABLET | Freq: Every morning | ORAL | 2 refills | Status: DC
Start: 2022-05-22 — End: 2022-08-09

## 2022-05-22 MED ORDER — BUPROPION HCL ER (XL) 300 MG PO TB24
300 MG | ORAL_TABLET | Freq: Every morning | ORAL | 3 refills | Status: DC
Start: 2022-05-22 — End: 2022-08-09

## 2022-05-22 MED ORDER — CLONAZEPAM 1 MG PO TABS
1 MG | ORAL_TABLET | Freq: Two times a day (BID) | ORAL | 3 refills | Status: DC | PRN
Start: 2022-05-22 — End: 2022-08-09

## 2022-05-22 NOTE — Progress Notes (Signed)
Patient:  Karen Logan  Age:  41 y.o.  DOB:  1981/06/24     SEX:  female MRN:  [id number]     RACE: American [place] / [place] Native     SEEN:    PATIENT    SPOUSE   OTHER:              PHQ-9  05/22/2022 02/22/2022 01/05/2022   Little interest or pleasure in doing things 3 0 0   Little interest or pleasure in doing things - - -   Feeling down, depressed, or hopeless 0 0 0   Trouble falling or staying asleep, or sleeping too much 0 0 0   Trouble falling or staying asleep, or sleeping too much - - -   Feeling tired or having little energy 0 1 0   Feeling tired or having little energy - - -   Poor appetite or overeating 3 1 0   Poor appetite, weight loss, or overeating - - -   Feeling bad about yourself - or that you are a failure or have let yourself or your family down 0 3 0   Feeling bad about yourself - or that you are a failure or have let yourself or your family down - - -   Trouble concentrating on things, such as reading the newspaper or watching television 0 2 0   Trouble concentrating on things such as school, work, reading, or watching TV - - -   Moving or speaking so slowly that other people could have noticed. Or the opposite - being so fidgety or restless that you have been moving around a lot more than usual 1 2 0   Moving or speaking so slowly that other people could have noticed; or the opposite being so fidgety that others notice - - -   Thoughts that you would be better off dead, or of hurting yourself in some way 0 0 0   Thoughts of being better off dead, or hurting yourself in some way - - -   PHQ-2 Score 3 0 0   Total Score PHQ 2 - - -   PHQ-9 Total Score 7 9 0   PHQ 9 Score - - -   If you checked off any problems, how difficult have these problems made it for you to do your work, take care of things at home, or get along with other people? 3 0 0   How difficult have these problems made it for you to do your work, take care of your home and get along with others - - -        GAD-7 SCREENING 05/22/2022 02/22/2022 01/05/2022   Feeling nervous, anxious, or on edge Not at all Nearly every day Not at all   Not being able to stop or control worrying Several days Not at all Not at all   Worrying too much about different things Nearly every day Not at all Not at all   Trouble relaxing Not at all Not at all Not at all   Being so restless that it is hard to sit still Not at all Not at all Not at all   Becoming easily annoyed or irritable Not at all Not at all Not at all   Feeling afraid as if something awful might happen Nearly every day Not at all Not at all   GAD-7 Total Score 7 3 0   How difficult have these problems made it for you  to do your work, take care of things at home, or get along with other people? Extremely difficult Somewhat difficult Not difficult at all   Feeling nervous, anxious, or on edge - - -   GAD-7 Total Score - - -            I was at home while conducting this encounter.    Consent:  She and/or her healthcare decision maker is aware that this patient-initiated Telehealth encounter is a billable service, with coverage as determined by her insurance carrier. She is aware that she may receive a bill and has provided verbal consent to proceed: YesPatient identification was verified, and a caregiver was present when appropriate. The patient was located in a state where the provider was credentialed to provide care.    This virtual visit was conducted via MyChart. Pursuant to the emergency declaration under the [facility] Act and the [employer], 1135 waiver authority and the [employer] Act, this Virtual  Visit was conducted to reduce the patient's risk of exposure to COVID-19 and provide continuity of care for an established patient. Services were provided through a video synchronous discussion virtually to substitute for in-person clinic visit.  Due to this being a TeleHealth evaluation, many elements of  the physical examination are unable to be assessed.     Total Time: minutes: 11-20 minutes.    Chief complaint:  Pt says she has normal get up and go and no motivation to do anything.    Subjective:  Seen virtually for follow-up.  States she has no get up and go and no motivation to do anything.  Does not want to do anything around the house.  Taking her medications.  Has not been taking the L-methylfolate because its been out of stock online.  States has been sleeping good but has vivid dreams.  Dreams about her father last night.  Gets along with her husband good.  Work is going good.  States has been seeing Lynn Ito for therapy.  Supportive psychotherapy provided.  Denies suicidal ideation/homicidal ideations.  Denies symptoms of psychosis.  Her GeneSight testing results reviewed with her.  We will decrease the dose of duloxetine to 30 mg in the morning and increase the dose of Wellbutrin to 300 mg daily.  Patient advised to titrate it up.  She has Wellbutrin SR 100 mg.  Advised to take 100 mg in the morning and 100 mg in the afternoon for 1 week then take Wellbutrin XL 300 mg once in the morning.      Patient Active Problem List   Diagnosis    Depression    Hypertension    Class 3 severe obesity without serious comorbidity with body mass index (BMI) of 40.0 to 44.9 in adult [facility])    Tinnitus of both ears    Intractable migraine without aura and without status migrainosus     LMP 05/05/2022, Birth control,   Not pregnant      There were no vitals taken for this visit.    Review of systems    Constitutional: denies significant weight loss/gain, fatigue    HEENT: denies rhinorrhea, sore throat.    Respiratory: denies cough, shortness of breath    Cardiovascular: denies chest pain, palpitation    GI: denies N/V/C/D, abdominal pain.    MSK: denies joint pain, muscle stiffness/soreness, back pain, neck pain.    Neuro: denies HA, dizziness.    In no acute distress.  MEDICATION REVIEW:    Current Medications:     Current Outpatient Medications   Medication Sig    DULoxetine (CYMBALTA) 30 MG extended release capsule Take 1 capsule by mouth daily (with breakfast)    clonazePAM (KLONOPIN) 1 MG tablet Take 0.5 tablets by mouth 2 times daily as needed for Anxiety for up to 120 days.    sertraline (ZOLOFT) 100 MG tablet Take 2 tablets by mouth every morning    buPROPion (WELLBUTRIN XL) 300 MG extended release tablet Take 1 tablet by mouth every morning    hydroCHLOROthiazide (HYDRODIURIL) 25 MG tablet Take 1 tablet by mouth once daily    MAGNESIUM PO Take by mouth    buPROPion (WELLBUTRIN SR) 100 MG extended release tablet Take 1 tablet by mouth every morning    Prenatal Vit-Fe Fumarate-FA (PRENATAL PO) Take by mouth    vitamin D3 (CHOLECALCIFEROL) 125 MCG (5000 UT) TABS tablet Take by mouth daily    Melatonin 5 MG CAPS Take by mouth    methylfolate (DEPLIN) 7.5 MG TABS tablet Take 7.5 tablets by mouth daily (Patient not taking: Reported on 05/22/2022)     No current facility-administered medications for this visit.       Allergies   Allergen Reactions    Levonorgestrel-Ethinyl Estrad Itching     Denies allergy 02/22/22    Hydrocodone-Acetaminophen Nausea Only and Swelling       Past Medical History, Past Surgical History, Family history, Social History, and Medications were all reviewed with the patient today and updated as necessary.     Compliant with medication: Yes   Side effects from medications:  No      No flowsheet data found.       EXAMINATION  Musculoskeletal    GAIT AND STATION    WNL    RESTRICTED    UNSTEADY WALK         ABNORMAL    UNBALANCED   WHEELCHAIR/  WALKER/CANE     PSYCHIATRIC    PSYCHOMOTOR     WNL    RETARDATION    AGITATION            GENERAL APPEARANCE:     WELL GROOMED      DISHEVELED     UNKEMPT        UNUSUAL/BIZZARE     WNL       ATTITUDE:    COOPERATIVE    GUARDED    SUSPICIOUS       HOSTILE                            BEHAVIOR:    CALM    HYPERACTIVE     MANNERISMS       BIZZARE         SPEECH:    NORMAL FOR CLIENT    SPONTANEOUS    SLURRED    WHISPERING       LOUD    PRESSURED    ARTICULATE        EYE CONTACT:    WNL    BLANK STARE    INTENSE       AVOIDANT         MOOD:    EUTHYMIC    ANXIOUS    DEPRESSED       IRRITABLE    ANGRY    APATHETIC     AFFECT:    CONGRUENT WITH MOOD    FLAT     CONSTRICTED       INAPPROPRIATE    LABILE           THOUGHT PROCESS:    LOGICAL/GOAL-DIRECTED    FOI    CIRCUMSANTIAL       INCOHERENT    TANGENTIAL    CONCRETE       PERSEVERATION           THOUGHT CONTENT:                DELUSIONS   DENIES   GRANDIOSE   PERSECUTORY   RELIGIOUS   REFERENCE   HALLUCINATIONS   DENIES   AUDITORY   VISUAL   OLFACTORY   TACTILE      GUSTATORY   SOMATIC         OBSESSIONS   DENIES   PRESENT         SUICIDAL IDEATION   DENIES   PRESENT W/O PLAN   PRESENT W/ PLAN       HOMICIDAL IDEATION   DENIES   PRESENT W/O PLAN   PRESENT W/ PLAN           JUDGEMENT:    GOOD    FAIR    POOR   INSIGHT:    GOOD    FAIR    POOR     COGNITION:           SENSORIUM:    ALERT    CLOUDED    DROWSY     ORIENTATION:    INTACT    TIME:  PLACE  PERSON   RECENT & REMOTE MEMORY:    NORMAL    OTHER:                  ATTENTION:    INTACT    MILD IMPAIRMENT    SEVERE IMPAIRMENT     CONCENTRATION:    INTACT    MILD IMPAIRMENT    SEVERE IMPAIRMENT     LANGUAGE:    AVERAGE    ABOVE AVERAGE    BELOW AVERAGE     FUND OF KNOWLEDGE:    UNABLE TO ASSESS AT THIS TIME    AVERAGE    ABOVE AVERAGE    BELOW AVERAGE       GOOD TO EXCELLENT KNOWLEDGE OF CURRENT EVENTS    POOR TO NO KNLEDGE OF CURRENT EVENTS                                          ABNORMAL MOVEMENTS:    NONE    TICS    TREMORS    BIZZARE       FACE    TRUNK    EXTREMETIES    GESTURES        SLEEP:    GOOD    FAIR    POOR      APPETITE:     GOOD    POOR    ERRATIC       MUSCLE STRENGTH AND TONE    WNL    ATROPHY    SPASTIC         FLACCID    COGWHEEL       Diagnoses/Impressions:    ICD-10-CM    1. Recurrent major depressive disorder, in partial remission (HCC)  F33.41 clonazePAM (KLONOPIN) 1 MG tablet      2. Generalized anxiety disorder with panic  attacks  F41.1 clonazePAM (KLONOPIN) 1 MG tablet    F41.0       3. Primary insomnia  F51.01 clonazePAM (KLONOPIN) 1 MG tablet      4. Poor motivation  R68.89           TREATMENT GOALS:  Symptom reduction, Medication adherence, maintain therapeutic gains    LABS/IMAGING:    []   Ordered [x]   Reviewed []   New Labs Ordered:     LAB  WBC   Date/Time Value Ref Range Status   02/21/2021 03:22 PM 9.1 4.3 - 11.1 K/uL Final     Hemoglobin   Date/Time Value Ref Range Status   02/21/2021 03:22 PM 12.1 11.7 - 15.4 g/dL Final     Hematocrit   Date/Time Value Ref Range Status   02/21/2021 03:22 PM 37.8 35.8 - 46.3 % Final     Platelets   Date/Time Value Ref Range Status   02/21/2021 03:22 PM 323 150 - 450 K/uL Final     Sodium   Date/Time Value Ref Range Status   02/21/2021 03:22 PM 141 136 - 145 mmol/L Final     Potassium   Date/Time Value Ref Range Status   02/21/2021 03:22 PM 3.7 3.5 - 5.1 mmol/L Final     Chloride   Date/Time Value Ref Range Status   02/21/2021 03:22 PM 106 98 - 107 mmol/L Final     CO2   Date/Time Value Ref Range Status   02/21/2021 03:22 PM 29 21 - 32 mmol/L Final     BUN   Date/Time Value Ref Range Status   02/21/2021 03:22 PM 7 6 - 23 MG/DL Final     ALT   Date/Time Value Ref Range Status   03/15/2021 02:19 PM 10 0 - 32 IU/L Final     AST   Date/Time Value Ref Range Status   03/15/2021 02:19 PM 14 0 - 40 IU/L Final     TSH   Date/Time Value Ref Range Status   09/01/2020 03:02 PM 0.818 0.450 - 4.500 uIU/mL Final       Please refer to the lab tab in the epic and care everywhere for the most recent lab results.    Plan:     [x]   Medication ordered: Wellbutrin, Klonopin, Cymbalta,  Zoloft to target depression, anxiety, insomnia, motivation.    [x]   Medication education/counseling provided  Medication dosage and time to take, purpose/expected benefits/risks, common side effects, lab monitoring required and reason, expected length of treatment, risk of no treatment, effects on pregnancy/nursing, financial availability discussed. Educated patient on  side effects/risks/benefits of meds including cardiac arrhythmias, suicidal ideations,orthostatic hypotension, serotonin syndrome, risk of mania/hypomania from antidepressants, withdrawals from abrupt discontinuation of meds, liver toxicity, risk of bleeding, risk of seizures, addiction potential, memory impairment with long term use of benzos, respiratory depression, high blood pressure, dizziness, drowsiness, sedation , risk of falls, Risk & benefits discussed: including but not limited to possible off-label medication usage.     [x]  Patient encouraged to contact the clinic if experiencing any adverse reactions with medications.      [x]  Follow MSE for sxs improvement     I have reviewed the patient's controlled substance prescription history, as maintained in the [place] prescription monitoring program, so that the prescription(s) for a  controlled substance can be given.    Recommendations and Referrals:    Follow up with : MD, requires monitoring of response to medication, requires monitoring of medication side effects.  Time until next PMA: 3 months/1 month/2 weeks    Follow up with Mental Health Clinicians recommended for : psychotherapy interventions,  monitoring to prevent decompensation /hospitalization, monitoring to maintain therapeutic gains, monitoring symptoms (resolving and controlled), to improve level of functioning,         Psychotherapy note:                                __10_ Minutes of psychotherapy     [x]   Supportive psychotherapy provided to improve self-esteem, psychological functioning, and adaptive skills.  Patient discussed certain situational and personal stressors ongoing in her life at this time, weight management d/w the patient. Sleep hygiene d/w patient. Patient allowed to vent out her emotions.  Scenarios were reviewed using role playing, CBT  techniques, and through exploration and interpretation of the patient's behavior  in order to increase insight and decrease anxiety.    Dysfunctional cognitions and the resulting problematic behavior addressed and alternate options dicussed.      []   Disposition planning      []   Dangerous and will not contract for safety in the community    **Pateint has been notified: They are to call 911 or go to their nearest E.R. if they are experiencing a medical emergency or suicidal ideations/homicidal ideations.**  All ancillary documentation entered reviewed by provider.      PLEASE NOTE:  This document has been produced in part or whole using voice recognition software. Proofread however unrecognized errors in transcription may be present.    , MD    , was evaluated through a synchronous (real-time) audio-video encounter. The patient (or guardian if applicable) is aware that this is a billable service, which includes applicable co-pays. This Virtual Visit was conducted with patient's (and/or legal guardian's) consent. Patient identification was verified, and a caregiver was present when appropriate.   The patient was located at Home: [address] [facility] [place]  Provider was located at Home (Appt Dept State): [facility]         Total time spent for this encounter: Not billed by time    --[facility], MD on 05/22/2022 at 4:15 PM    An electronic signature was used to authenticate this note.

## 2022-06-08 ENCOUNTER — Encounter: Attending: Mental Health | Primary: Internal Medicine

## 2022-06-21 ENCOUNTER — Encounter: Attending: Mental Health | Primary: Internal Medicine

## 2022-07-14 ENCOUNTER — Encounter

## 2022-07-14 NOTE — Telephone Encounter (Signed)
As of last office note (01/05/2022), patient is still taking this medication however patient does not have a scheduled follow up for Hypertension Management. Pended medication has correct quantity and pended to preferred pharmacy.

## 2022-07-15 MED ORDER — HYDROCHLOROTHIAZIDE 25 MG PO TABS
25 MG | ORAL_TABLET | ORAL | 0 refills | Status: DC
Start: 2022-07-15 — End: 2022-12-19

## 2022-08-09 ENCOUNTER — Telehealth: Admit: 2022-08-09 | Discharge: 2022-08-09 | Attending: Psychiatry | Primary: Internal Medicine

## 2022-08-09 DIAGNOSIS — F332 Major depressive disorder, recurrent severe without psychotic features: Secondary | ICD-10-CM

## 2022-08-09 MED ORDER — SERTRALINE HCL 100 MG PO TABS
100 MG | ORAL_TABLET | Freq: Every morning | ORAL | 3 refills | Status: AC
Start: 2022-08-09 — End: 2022-11-29

## 2022-08-09 MED ORDER — DULOXETINE HCL 60 MG PO CPEP
60 MG | ORAL_CAPSULE | Freq: Every day | ORAL | 3 refills | Status: AC
Start: 2022-08-09 — End: 2022-11-29

## 2022-08-09 MED ORDER — BUPROPION HCL ER (XL) 300 MG PO TB24
300 MG | ORAL_TABLET | Freq: Every morning | ORAL | 3 refills | Status: AC
Start: 2022-08-09 — End: 2022-11-29

## 2022-08-09 MED ORDER — CLONAZEPAM 1 MG PO TABS
1 MG | ORAL_TABLET | Freq: Two times a day (BID) | ORAL | 3 refills | Status: DC | PRN
Start: 2022-08-09 — End: 2022-11-29

## 2022-08-09 NOTE — Progress Notes (Signed)
Patient:  Karen Logan  Age:  41 y.o.  DOB:  05-27-1981     SEX:  female MRN:  [id number]     RACE: American [place] / [place] Native     SEEN:    PATIENT    SPOUSE   OTHER:              PHQ-9  08/09/2022 05/22/2022 02/22/2022   Little interest or pleasure in doing things 2 3 0   Little interest or pleasure in doing things - - -   Feeling down, depressed, or hopeless 3 0 0   Trouble falling or staying asleep, or sleeping too much 3 0 0   Trouble falling or staying asleep, or sleeping too much - - -   Feeling tired or having little energy 3 0 1   Feeling tired or having little energy - - -   Poor appetite or overeating Poor appetite, weight loss, or overeating - - -   Feeling bad about yourself - or that you are a failure or have let yourself or your family down 3 0 3   Feeling bad about yourself - or that you are a failure or have let yourself or your family down - - -   Trouble concentrating on things, such as reading the newspaper or watching television 3 0 2   Trouble concentrating on things such as school, work, reading, or watching TV - - -   Moving or speaking so slowly that other people could have noticed. Or the opposite - being so fidgety or restless that you have been moving around a lot more than usual Moving or speaking so slowly that other people could have noticed; or the opposite being so fidgety that others notice - - -   Thoughts that you would be better off dead, or of hurting yourself in some way 0 0 0   Thoughts of being better off dead, or hurting yourself in some way - - -   PHQ-2 Score 5 3 0   Total Score PHQ 2 - - -   PHQ-9 Total Score PHQ 9 Score - - -   If you checked off any problems, how difficult have these problems made it for you to do your work, take care of things at home, or get along with other people? 2 3 0   How difficult have these problems made it for you to do your work, take care of your home and get along with others - - -        GAD-7 SCREENING 08/06/2022 05/22/2022 02/22/2022   Feeling nervous, anxious, or on edge Nearly every day Not at all Nearly every day   Not being able to stop or control worrying More than half the days Several days Not at all   Worrying too much about different things More than half the days Nearly every day Not at all   Trouble relaxing Nearly every day Not at all Not at all   Being so restless that it is hard to sit still Nearly every day Not at all Not at all   Becoming easily annoyed or irritable Several days Not at all Not at all   Feeling afraid as if something awful might happen Nearly every day Nearly every day Not at all   GAD-7 Total Score How difficult have these problems made  it for you to do your work, take care of things at home, or get along with other people? Extremely difficult Extremely difficult Somewhat difficult   Feeling nervous, anxious, or on edge - - -   GAD-7 Total Score - - -            I was at home while conducting this encounter.    Consent:  She and/or her healthcare decision maker is aware that this patient-initiated Telehealth encounter is a billable service, with coverage as determined by her insurance carrier. She is aware that she may receive a bill and has provided verbal consent to proceed: YesPatient identification was verified, and a caregiver was present when appropriate. The patient was located in a state where the provider was credentialed to provide care.    This virtual visit was conducted via MyChart. Pursuant to the emergency declaration under the [facility] Act and the [employer], 1135 waiver authority and the [employer] Act, this Virtual  Visit was conducted to reduce the patient's risk of exposure to COVID-19 and provide continuity of care for an established patient. Services were provided through a video synchronous discussion virtually to substitute for in-person clinic visit.  Due to  this being a TeleHealth evaluation, many elements of the physical examination are unable to be assessed.     Total Time: minutes: 11-20 minutes.    Chief complaint:  Pt says she is going back into depression again.    Subjective:  Seen virtually for follow-up.  States going back into depression again.  She has been sleeping more.  Trying to keep on her regular schedule.  Lost her job.  Was laid off.  Has been applying for work.  Lost her health insurance because of that.  Advised to apply for health insurance with her husband's job.  States she was doing so good in the school for 20 years.  It stings to lose job.  Also her husband's grandmother's passing and it is bringing back memories from when her dad passed away 2 years ago.  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  She got denied for loan forgiveness.  She is married has no kids and her husband makes good money so she was denied for loan forgiveness.  They went for vacation 3 weeks ago and just a week before vacation she lost her job.  They [place] and [address] and had a good time.  It was mainly for her husband because he works crazy hours.  Supportive psychotherapy provided.  Patient denies suicidal ideation/homicidal ideations.  Denies symptoms psychosis.  We will up the dose of Cymbalta back to 60 mg daily, advised to start back on L-methylfolate.  Continue rest of the medications at current doses.  Patient has not seen Lynn Ito since May.  Advised to make an appointment.      Patient Active Problem List   Diagnosis    Depression    Hypertension    Class 3 severe obesity without serious comorbidity with body mass index (BMI) of 40.0 to 44.9 in adult [facility])    Tinnitus of both ears    Intractable migraine without aura and without status migrainosus     LMP 07/31/2022, Birth control,   Not pregnant      There were no vitals taken for this visit.    Denies palpitations, chest pain, headaches. In no acute distress.     MEDICATION  REVIEW:    Current Medications:    Current Outpatient Medications   Medication Sig    DULoxetine (CYMBALTA) 60 MG extended release capsule Take 1 capsule by mouth daily (with breakfast)    sertraline (ZOLOFT) 100 MG tablet Take 2 tablets by mouth every morning    clonazePAM (KLONOPIN) 1 MG tablet Take 0.5 tablets by mouth 2 times daily as needed for Anxiety for up to 120 days.    buPROPion (WELLBUTRIN XL) 300 MG extended release tablet Take 1 tablet by mouth every morning    hydroCHLOROthiazide (HYDRODIURIL) 25 MG tablet Take 1 tablet by mouth once daily    MAGNESIUM PO Take by mouth    Prenatal Vit-Fe Fumarate-FA (PRENATAL PO) Take by mouth    vitamin D3 (CHOLECALCIFEROL) 125 MCG (5000 UT) TABS tablet Take by mouth daily    Melatonin 5 MG CAPS Take by mouth    methylfolate (DEPLIN) 7.5 MG TABS tablet Take 7.5 tablets by mouth daily (Patient not taking: Reported on 05/22/2022)     No current facility-administered medications for this visit.       Allergies   Allergen Reactions    Levonorgestrel-Ethinyl Estrad Itching     Denies allergy 02/22/22    Hydrocodone-Acetaminophen Nausea Only and Swelling       Past Medical History, Past Surgical History, Family history, Social History, and Medications were all reviewed with the patient today and updated as necessary.     Compliant with medication: Yes   Side effects from medications:  No      No flowsheet data found.       EXAMINATION  Musculoskeletal    GAIT AND STATION   []  WNL   []  RESTRICTED   []  UNSTEADY WALK        []  ABNORMAL   []  UNBALANCED  []  WHEELCHAIR/  WALKER/CANE     PSYCHIATRIC    PSYCHOMOTOR   [x]   WNL   []  RETARDATION   []  AGITATION            GENERAL APPEARANCE:   [x]   WELL GROOMED []      DISHEVELED   []   UNKEMPT      []   UNUSUAL/BIZZARE    []  WNL       ATTITUDE:   [x]  COOPERATIVE   []  GUARDED   []  SUSPICIOUS      []  HOSTILE                            BEHAVIOR:   [x]  CALM   []  HYPERACTIVE   []  MANNERISMS      []  BIZZARE         SPEECH:   [x]  NORMAL FOR  CLIENT   []  SPONTANEOUS   []  SLURRED   []  WHISPERING      []  LOUD   []  PRESSURED   []  ARTICULATE        EYE CONTACT:   [x]  WNL   []  BLANK STARE   []  INTENSE      []  AVOIDANT         MOOD:   []  EUTHYMIC   [x]  ANXIOUS   [x]  DEPRESSED      []  IRRITABLE   []  ANGRY   []  APATHETIC     AFFECT:   [x]  CONGRUENT WITH MOOD   []  FLAT   []  CONSTRICTED      []  INAPPROPRIATE   []  LABILE  THOUGHT PROCESS:    LOGICAL/GOAL-DIRECTED    FOI    CIRCUMSANTIAL       INCOHERENT    TANGENTIAL    CONCRETE       PERSEVERATION           THOUGHT CONTENT:                DELUSIONS   DENIES   GRANDIOSE   PERSECUTORY   RELIGIOUS   REFERENCE   HALLUCINATIONS   DENIES   AUDITORY   VISUAL   OLFACTORY   TACTILE      GUSTATORY   SOMATIC         OBSESSIONS   DENIES   PRESENT         SUICIDAL IDEATION   DENIES   PRESENT W/O PLAN   PRESENT W/ PLAN       HOMICIDAL IDEATION   DENIES   PRESENT W/O PLAN   PRESENT W/ PLAN           JUDGEMENT:    GOOD    FAIR    POOR   INSIGHT:    GOOD    FAIR    POOR     COGNITION:           SENSORIUM:    ALERT    CLOUDED    DROWSY     ORIENTATION:    INTACT    TIME:  PLACE  PERSON   RECENT & REMOTE MEMORY:    NORMAL    OTHER:                  ATTENTION:    INTACT    MILD IMPAIRMENT    SEVERE IMPAIRMENT     CONCENTRATION:    INTACT    MILD IMPAIRMENT    SEVERE IMPAIRMENT     LANGUAGE:    AVERAGE    ABOVE AVERAGE    BELOW AVERAGE     FUND OF KNOWLEDGE:    UNABLE TO ASSESS AT THIS TIME    AVERAGE    ABOVE AVERAGE    BELOW AVERAGE       GOOD TO EXCELLENT KNOWLEDGE OF CURRENT EVENTS    POOR TO NO KNLEDGE OF CURRENT EVENTS                                          ABNORMAL MOVEMENTS:    NONE    TICS    TREMORS    BIZZARE       FACE    TRUNK    EXTREMETIES    GESTURES        SLEEP:    GOOD    FAIR    POOR      APPETITE:    GOOD    POOR    ERRATIC       MUSCLE STRENGTH AND  TONE    WNL    ATROPHY    SPASTIC         FLACCID    COGWHEEL       Diagnoses/Impressions:    ICD-10-CM    1. Major depressive disorder, recurrent severe without psychotic features (HCC)  F33.2       2. Generalized anxiety disorder with panic attacks  F41.1 clonazePAM (KLONOPIN) 1 MG tablet    F41.0       3. Primary insomnia  F51.01 clonazePAM (KLONOPIN)  1 MG tablet      4. Poor motivation  R68.89       5. Grief reaction with prolonged bereavement  F43.29       6. Psychosocial stressors  Z65.8           TREATMENT GOALS:  Symptom reduction, Medication adherence, maintain therapeutic gains    LABS/IMAGING:    []   Ordered [x]   Reviewed []   New Labs Ordered:     LAB  WBC   Date/Time Value Ref Range Status   02/21/2021 03:22 PM 9.1 4.3 - 11.1 K/uL Final     Hemoglobin   Date/Time Value Ref Range Status   02/21/2021 03:22 PM 12.1 11.7 - 15.4 g/dL Final     Hematocrit   Date/Time Value Ref Range Status   02/21/2021 03:22 PM 37.8 35.8 - 46.3 % Final     Platelets   Date/Time Value Ref Range Status   02/21/2021 03:22 PM 323 150 - 450 K/uL Final     Sodium   Date/Time Value Ref Range Status   02/21/2021 03:22 PM 141 136 - 145 mmol/L Final     Potassium   Date/Time Value Ref Range Status   02/21/2021 03:22 PM 3.7 3.5 - 5.1 mmol/L Final     Chloride   Date/Time Value Ref Range Status   02/21/2021 03:22 PM 106 98 - 107 mmol/L Final     CO2   Date/Time Value Ref Range Status   02/21/2021 03:22 PM 29 21 - 32 mmol/L Final     BUN   Date/Time Value Ref Range Status   02/21/2021 03:22 PM 7 6 - 23 MG/DL Final     ALT   Date/Time Value Ref Range Status   03/15/2021 02:19 PM 10 0 - 32 IU/L Final     AST   Date/Time Value Ref Range Status   03/15/2021 02:19 PM 14 0 - 40 IU/L Final     TSH   Date/Time Value Ref Range Status   09/01/2020 03:02 PM 0.818 0.450 - 4.500 uIU/mL Final       Please refer to the lab tab in the epic and care everywhere for the most recent lab results.    Plan:     [x]   Medication ordered: Cymbalta,  Zoloft, Wellbutrin, Klonopin to target depression, anxiety, insomnia.  Patient also advised to start back taking L-methylfolate.    [x]   Medication education/counseling provided  Medication dosage and time to take, purpose/expected benefits/risks, common side effects, lab monitoring required and reason, expected length of treatment, risk of no treatment, effects on pregnancy/nursing, financial availability discussed. Educated patient on  side effects/risks/benefits of meds including  cardiac arrhythmias, suicidal ideations, orthostatic hypotension, serotonin syndrome, risk of mania/hypomania from antidepressants, withdrawals from abrupt discontinuation of meds, liver toxicity, risk of bleeding, risk of seizures, addiction potential, memory impairment with long term use of benzos, respiratory depression, high blood pressure, dizziness, drowsiness, sedation , risk of falls, Risk & benefits discussed: including but not limited to possible off-label medication usage.     [x]  Patient encouraged to contact the clinic if experiencing any adverse reactions with medications.      [x]  Follow MSE for sxs improvement     I have reviewed the patient's controlled substance prescription history, as maintained in the 05/15/2021 prescription monitoring program, so that the prescription(s) for a  controlled substance can be given.    Recommendations and Referrals:    Follow up with : MD, requires monitoring of  response to medication, requires monitoring of medication side effects.    Time until next PMA: 3 months/1 month/2 weeks    Follow up with Mental Health Clinicians recommended for : psychotherapy interventions,  monitoring to prevent decompensation /hospitalization, monitoring to maintain therapeutic gains, monitoring symptoms (resolving and controlled), to improve level of functioning,         Psychotherapy note:                                __10_ Minutes of psychotherapy     [x]   Supportive psychotherapy provided to  improve self-esteem, psychological functioning, and adaptive skills. Patient discussed certain situational and personal stressors ongoing in her life at this time, weight management d/w the patient. Sleep hygiene d/w patient. Patient allowed to vent out her emotions.  Scenarios were reviewed using role playing, CBT  techniques, and through exploration and interpretation of the patient's behavior  in order to increase insight and decrease anxiety.    Dysfunctional cognitions and the resulting problematic behavior addressed and alternate options dicussed.      []   Disposition planning      []   Dangerous and will not contract for safety in the community    **Pateint has been notified: They are to call 911 or go to their nearest E.R. if they are experiencing a medical emergency or suicidal ideations/homicidal ideations.**  All ancillary documentation entered reviewed by provider.      PLEASE NOTE:  This document has been produced in part or whole using voice recognition software. Proofread however unrecognized errors in transcription may be present.    , MD    , was evaluated through a synchronous (real-time) audio-video encounter. The patient (or guardian if applicable) is aware that this is a billable service, which includes applicable co-pays. This Virtual Visit was conducted with patient's (and/or legal guardian's) consent. Patient identification was verified, and a caregiver was present when appropriate.   The patient was located at Home: [address] [facility]  Provider was located at Home (Appt Dept State): [facility]         Total time spent for this encounter: Not billed by time    --[facility], MD on 08/09/2022 at 4:40 PM    An electronic signature was used to authenticate this note.            Would like to discuss wellbutrin. Feels like she is slipping back into a dark area.

## 2022-08-28 ENCOUNTER — Encounter

## 2022-09-05 ENCOUNTER — Encounter: Admit: 2022-09-05 | Discharge: 2022-09-05 | Attending: Internal Medicine | Primary: Internal Medicine

## 2022-09-05 DIAGNOSIS — I10 Essential (primary) hypertension: Secondary | ICD-10-CM

## 2022-09-05 NOTE — Progress Notes (Signed)
Karen Logan, D.O.   [facility]  [address]  [place],  [place] [address]  Tel: [phone]    Office Visit: Follow Up     Patient Name: Karen Logan   DOB:  28-Sep-1981   MRN:   [id number]      Today's Date: 09/05/22 5:34 PM    Subjective     The patient is a 41 y.o.-year-old female who presents for follow-up.    Left knee pain  -Patient has saw Dr. Melanee Left on 01/23/2022, she was referred to Dr. Shawnie Pons for evaluation of her right foot and her conservative options versus surgical options were discussed with the patient.  She was supposed to follow-up in March 2023 but it does not appear that she did so.  -she states her knee is better and does not hurt anymore      Hypertension  -Hydrochlorothiazide 25 mg daily  -Patient states she would like to wean off her blood pressure medication because any blood pressure medication she has tried including hydrochlorothiazide causes her to sweat profusely from her head only for about 2 hours after she takes it.     Anxiety and depression, well controlled   -Zoloft 200 mg daily  -Cymbalta 60 mg daily  -Klonopin 0.5 mg 2 times daily as needed  -Wellbutrin XL 300 mg daily  -Patient is following with Dr. Sharol Given, last seen 08/09/2022  -Patient has follow-up with Dr. Sharol Given 11/29/2022    Obesity  -BMI 39.94, weight 240 pounds, down 16 pounds since January 2023  -Patient states she is interested in Ozempic for weight loss.  -She states that she has been working really hard to eat healthier and try to get more exercise by walking a lot at work and around her house.    Today:   No new complaints today.  Review of Systems   All other systems reviewed and are negative.       Past Medical History:   Diagnosis Date    Allergies     Anxiety     Bilateral calf pain 09/01/2020    Bladder problem     Carpal tunnel syndrome     Class 3 severe obesity without serious comorbidity with body mass index (BMI) of 40.0 to 44.9 in adult [facility]) 09/01/2020    Depression      Encounter to establish care 09/01/2020    Headache     Hypertension     Intractable migraine without aura and without status migrainosus 03/15/2021    Panic attack 1997    Tinnitus of both ears 09/01/2020     Social History     Socioeconomic History    Marital status: Married     Spouse name: Not on file    Number of children: Not on file    Years of education: Not on file    Highest education level: Not on file   Occupational History    Not on file   Tobacco Use    Smoking status: Never     Passive exposure: Never    Smokeless tobacco: Never   Vaping Use    Vaping Use: Never used   Substance and Sexual Activity    Alcohol use: Never    Drug use: Never    Sexual activity: Yes     Partners: Male     Comment: My husband   Other Topics Concern    Not on file   Social History Narrative    Not  on file     Social Determinants of Health     Financial Resource Strain: Low Risk  (09/05/2022)    Overall Financial Resource Strain (CARDIA)     Difficulty of Paying Living Expenses: Not hard at all   Food Insecurity: No Food Insecurity (09/05/2022)    Hunger Vital Sign     Worried About Running Out of Food in the Last Year: Never true     Ran Out of Food in the Last Year: Never true   Transportation Needs: Unknown (09/05/2022)    PRAPARE - Therapist, art (Medical): Not on file     Lack of Transportation (Non-Medical): No   Physical Activity: Not on file   Stress: Not on file   Social Connections: Not on file   Intimate Partner Violence: Not on file   Housing Stability: Unknown (09/05/2022)    Housing Stability Vital Sign     Unable to Pay for Housing in the Last Year: Not on file     Number of Places Lived in the Last Year: Not on file     Unstable Housing in the Last Year: No         Current Outpatient Medications   Medication Sig    DULoxetine (CYMBALTA) 60 MG extended release capsule Take 1 capsule by mouth daily (with breakfast)    sertraline (ZOLOFT) 100 MG tablet Take 2 tablets by mouth every morning     clonazePAM (KLONOPIN) 1 MG tablet Take 0.5 tablets by mouth 2 times daily as needed for Anxiety for up to 120 days.    buPROPion (WELLBUTRIN XL) 300 MG extended release tablet Take 1 tablet by mouth every morning    hydroCHLOROthiazide (HYDRODIURIL) 25 MG tablet Take 1 tablet by mouth once daily    MAGNESIUM PO Take by mouth    Prenatal Vit-Fe Fumarate-FA (PRENATAL PO) Take by mouth    methylfolate (DEPLIN) 7.5 MG TABS tablet Take 7.5 tablets by mouth daily    vitamin D3 (CHOLECALCIFEROL) 125 MCG (5000 UT) TABS tablet Take by mouth daily    Melatonin 5 MG CAPS Take by mouth     No current facility-administered medications for this visit.        Objective     Vitals:    09/05/22 1532   BP: 121/68   Site: Left Upper Arm   Position: Sitting   Cuff Size: Large Adult   Pulse: 66   Resp: 12   Temp: 97.4 F (36.3 C)   TempSrc: Temporal   SpO2: 97%   Weight: 240 lb (108.9 kg)   Height: 5\' 5"  (1.651 m)        Physical Exam:  Constitutional: Appears well kempt. Alert/oriented x3. In no acute distress.  Head: Normocephalic No trauma. No deformity.   Cardiac: Heart with normal rate/rhythm. No murmurs. No gallops. Pulses normal.   Pulmonary: Lungs clear to auscultation bilaterally. In no respiratory distress. No wheezing. No rales. No rhonci.   Psychiatric: Normal thought content. Normal behavior. Normal judgment.     Recommendations     Assessment:  Patient Active Problem List   Diagnosis    Depression    Hypertension    Class 3 severe obesity without serious comorbidity with body mass index (BMI) of 40.0 to 44.9 in adult [facility])    Tinnitus of both ears    Intractable migraine without aura and without status migrainosus    Chronic bilateral low back pain without sciatica  Chronic neck pain      Status of above conditions: Stable    Plan:  Hypertension  -Hydrochlorothiazide 25 mg daily, patient will wean off her hydrochlorothiazide over the next few weeks she will cut the pill in half daily for the next 2 weeks and then  take half a pill every other day for a week.  She will stop.  -We will follow-up with her in 6 weeks to assess her blood pressure and she will take her blood pressure at home and keep a log shows this log at her next appointment.  If her blood pressures still controlled she may remain off blood pressure medications, if it is not controlled at home off medication, then we may need to start a different class of medication, likely amlodipine.     Anxiety and depression, well controlled   -Zoloft 200 mg daily  -Cymbalta 60 mg daily  -Klonopin 0.5 mg 2 times daily as needed  -Wellbutrin XL 300 mg daily  -Patient is following with Dr. Margart Sickles, last seen 08/09/2022  -Patient has follow-up with Dr. Margart Sickles 11/29/2022    Obesity  -BMI 39.94, weight 240 pounds, down 16 pounds since January 2023  -Continue diet and exercise  -Patient cannot afford Ozempic and she is not diabetic so we will not be ordering this for her today.  -Discussed other medications but most of the medications that we would use may raise her blood pressure and if she is try to come off her blood pressure medication this is not a good idea at the time.  -I encouraged the patient to ramp up her exercise and continue eating small frequent meals and healthy food.    -Previous medical records and/or labs/tests available to me reviewed, any records outstanding not available requested  -The risks, benefits, and medical necessity of all medications, tests labs, and any other orders that were ordered at today's visit were discussed with the patient including all elective or patient requested orders.  Decision to order or not order tests are based on this risk/benefit ratio and medical necessity.  -The patient expresses understanding of the plan as I've explained it to her and is in agreement with the current plan.    Follow Up: 6 weeks for blood pressure check    Total Time: 30 minutes (chart reviewing and in-exam time)    Signed: Martin[place] Gonsalo Cuthbertson, D.O.  09/05/22  5:34  PM

## 2022-10-11 ENCOUNTER — Encounter

## 2022-10-14 ENCOUNTER — Encounter

## 2022-10-16 ENCOUNTER — Encounter

## 2022-10-18 ENCOUNTER — Encounter: Attending: Internal Medicine | Primary: Internal Medicine

## 2022-10-18 NOTE — Progress Notes (Deleted)
[place] Telly Jawad, D.O.   [facility]  [address]  [place], [place] [address]  Tel: [phone]    Virtual Visit: Follow Up     Patient Name: Karen Logan   DOB:  Sep 10, 1981   MRN:   [id number]      Today's Date: 10/18/22 12:11 PM    Subjective     The patient is a 41 y.o.-year-old female who presents via telehealth for follow-up.    Hypertension  -Hydrochlorothiazide 25 mg daily, patient will wean off her hydrochlorothiazide over the next few weeks she will cut the pill in half daily for the next 2 weeks and then take half a pill every other day for a week.  She will stop.  -We will follow-up with her in 6 weeks to assess her blood pressure and she will take her blood pressure at home and keep a log shows this log at her next appointment.  If her blood pressures still controlled she may remain off blood pressure medications, if it is not controlled at home off medication, then we may need to start a different class of medication, likely amlodipine.     Anxiety and depression, well controlled   -Zoloft 200 mg daily  -Cymbalta 60 mg daily  -Klonopin 0.5 mg 2 times daily as needed  -Wellbutrin XL 300 mg daily  -Patient is following with Dr. Sharol Given, last seen 08/09/2022  -Patient has follow-up with Dr. Sharol Given 11/29/2022     Obesity  -BMI 39.94, weight 240 pounds, down 16 pounds since January 2023  -Continue diet and exercise    Today:     Review of Systems     Past Medical History:   Diagnosis Date    Allergies     Anxiety     Bilateral calf pain 09/01/2020    Bladder problem     Carpal tunnel syndrome     Class 3 severe obesity without serious comorbidity with body mass index (BMI) of 40.0 to 44.9 in adult [facility]) 09/01/2020    Depression     Encounter to establish care 09/01/2020    Headache     Hypertension     Intractable migraine without aura and without status migrainosus 03/15/2021    Panic attack 1997    Tinnitus of both ears 09/01/2020     Social History     Socioeconomic History    Marital  status: Married     Spouse name: Not on file    Number of children: Not on file    Years of education: Not on file    Highest education level: Not on file   Occupational History    Not on file   Tobacco Use    Smoking status: Never     Passive exposure: Never    Smokeless tobacco: Never   Vaping Use    Vaping Use: Never used   Substance and Sexual Activity    Alcohol use: Never    Drug use: Never    Sexual activity: Yes     Partners: Male     Comment: My husband   Other Topics Concern    Not on file   Social History Narrative    Not on file     Social Determinants of Health     Financial Resource Strain: Low Risk  (09/05/2022)    Overall Financial Resource Strain (CARDIA)     Difficulty of Paying Living Expenses: Not hard at all   Food Insecurity: No  Food Insecurity (09/05/2022)    Hunger Vital Sign     Worried About Running Out of Food in the Last Year: Never true     Ran Out of Food in the Last Year: Never true   Transportation Needs: Unknown (09/05/2022)    PRAPARE - Therapist, art (Medical): Not on file     Lack of Transportation (Non-Medical): No   Physical Activity: Not on file   Stress: Not on file   Social Connections: Not on file   Intimate Partner Violence: Not on file   Housing Stability: Unknown (09/05/2022)    Housing Stability Vital Sign     Unable to Pay for Housing in the Last Year: Not on file     Number of Places Lived in the Last Year: Not on file     Unstable Housing in the Last Year: No         Current Outpatient Medications   Medication Sig    DULoxetine (CYMBALTA) 60 MG extended release capsule Take 1 capsule by mouth daily (with breakfast)    sertraline (ZOLOFT) 100 MG tablet Take 2 tablets by mouth every morning    clonazePAM (KLONOPIN) 1 MG tablet Take 0.5 tablets by mouth 2 times daily as needed for Anxiety for up to 120 days.    buPROPion (WELLBUTRIN XL) 300 MG extended release tablet Take 1 tablet by mouth every morning    hydroCHLOROthiazide (HYDRODIURIL) 25 MG  tablet Take 1 tablet by mouth once daily    MAGNESIUM PO Take by mouth    Prenatal Vit-Fe Fumarate-FA (PRENATAL PO) Take by mouth    methylfolate (DEPLIN) 7.5 MG TABS tablet Take 7.5 tablets by mouth daily    vitamin D3 (CHOLECALCIFEROL) 125 MCG (5000 UT) TABS tablet Take by mouth daily    Melatonin 5 MG CAPS Take by mouth     No current facility-administered medications for this visit.        Objective     There were no vitals filed for this visit.     Physical Exam:  Constitutional: Appears well kempt. Alert/oriented x3. In no acute distress.  Head: Normocephalic No trauma.   Psychiatric: Normal thought content. Normal behavior. Normal judgment.     Recommendations     Assessment:  Patient Active Problem List   Diagnosis    Depression    Hypertension    Class 3 severe obesity without serious comorbidity with body mass index (BMI) of 40.0 to 44.9 in adult [facility])    Tinnitus of both ears    Intractable migraine without aura and without status migrainosus    Chronic bilateral low back pain without sciatica    Chronic neck pain        Plan:  -    -Previous medical records and/or labs/tests available to me reviewed, any records outstanding not available requested  -The risks, benefits, and medical necessity of all medications, tests labs, and any other orders that were ordered at today's visit were discussed with the patient including all elective or patient requested orders.  Decision to order or not order tests are based on this risk/benefit ratio and medical necessity.  -The patient expresses understanding of the plan as I've explained it to her and is in agreement with the current plan.    Follow Up:     Signed: [place] Jayziah Bankhead, D.O.  10/18/22  12:11 PM    This visit was conducted using telehealth services of either [employer] or MyChart.  The physical exam was limited due to the nature of the virtual visit.  The patient's consent was obtained to conduct such a visit.  Total time face-to-face via video was 15 minutes.   All concerns were discussed and addressed at this visit.  If the patient has any other questions or concerns they are to call to schedule an in person appointment or if it is an emergency, go to the ER.  Time Spent Pre- and Post Reviewing patient's chart: 15 minutes  Total Time: 30 minutes

## 2022-10-23 ENCOUNTER — Encounter

## 2022-10-25 NOTE — Telephone Encounter (Signed)
Patient called with complaint that she has a throat swelling, feels as if something is stuck, feels that problem is getting slowly worse than earlier.  Patient directed to the ER.

## 2022-10-29 ENCOUNTER — Emergency Department: Admit: 2022-10-29 | Primary: Internal Medicine

## 2022-10-29 ENCOUNTER — Inpatient Hospital Stay: Admit: 2022-10-29 | Discharge: 2022-10-29 | Disposition: A | Discharge status: Home / Self Care

## 2022-10-29 DIAGNOSIS — M25571 Pain in right ankle and joints of right foot: Secondary | ICD-10-CM

## 2022-10-29 DIAGNOSIS — M7989 Other specified soft tissue disorders: Secondary | ICD-10-CM

## 2022-10-29 NOTE — ED Notes (Signed)
I have reviewed discharge instructions with the patient.  The patient verbalized understanding.    Patient left ED via Discharge Method: ambulatory to Home with self    Opportunity for questions and clarification provided.       Patient given 0 scripts.         To continue your aftercare when you leave the hospital, you may receive an automated call from our care team to check in on how you are doing.  This is a free service and part of our promise to provide the best care and service to meet your aftercare needs." If you have questions, or wish to unsubscribe from this service please call [phone].  Thank you for Choosing our [facility] Emergency Department.         Silverio Decamp, RN  10/29/22 1753

## 2022-10-29 NOTE — Discharge Instructions (Addendum)
Your x-ray is normal.  Follow-up with orthopedics if symptoms persist.  Elevate your ankle when at rest.  Apply ice for 10 or 15 minutes at a time every 3-4 hours.  Take Tylenol or ibuprofen if needed for pain.  Return to the emergency department for any new, worsening, or concerning symptoms.

## 2022-10-29 NOTE — ED Provider Notes (Signed)
Emergency Department Provider Note       PCP: Grnak, Martin[place] J, DO   Age: 41 y.o.   Sex: female     DISPOSITION Decision To Discharge 10/29/2022 05:35:53 PM       ICD-10-CM    1. Acute right ankle pain  [address]          Medical Decision Making     Complexity of Problems Addressed:  Complexity of Problem: 1 acute, uncomplicated illness or injury.    Data Reviewed and Analyzed:  I independently ordered and reviewed each unique test.         I interpreted the X-rays.  No acute fracture.  Agree with radiologist impression.    Discussion of management or test interpretation.  41 year old female presents emergency department today with complaint of ongoing right ankle pain.  She appears in no acute distress.  Current symptoms began a few days ago.  Neurovascularly intact to lower extremities.  She is noted to be ambulating around the department without difficulty.  Imaging shows no acute fracture.  Refer to orthopedics.  Return precautions discussed.  Work note provided.       Risk of Complications and/or Morbidity of Patient Management:  Shared medical decision making was utilized in creating the patients health plan today.    History      41 year old female presents emergency department today with complaint of right lateral ankle pain and swelling.  Patient states that she has a history of an injury to this ankle before and required surgery Avril years ago.  She states that about a year ago, she injured her ankle and since then, she has had issues with swelling and pain in the ankle.  She states she has not had it looked at.  She has not had an x-ray.  She denies any recent falls, trauma, or known injury.  This pain and swelling has prevented her from being able to work today.  She denies any treatment for her symptoms.  Pain is worse with ambulation.  Pain is relieved at rest.    The history is provided by the patient.        Physical Exam      Vitals signs and nursing note reviewed.   Vitals:    10/29/22 1635 10/29/22 1636 10/29/22 1637   BP:  125/82    Pulse: 84 77    Temp:  98.1 F (36.7 C)    SpO2:  99%    Weight:   95.3 kg (210 lb)       Physical Exam  Vitals and nursing note reviewed.   Constitutional:       General: She is not in acute distress.     Appearance: Normal appearance. She is well-developed. She is not ill-appearing, toxic-appearing or diaphoretic.   HENT:      Head: Normocephalic and atraumatic.   Eyes:      General: No scleral icterus.     Extraocular Movements: Extraocular movements intact.   Cardiovascular:      Rate and Rhythm: Normal rate.      Pulses:           Dorsalis pedis pulses are 2+ on the right side.        Posterior tibial pulses are 2+ on the right side.   Pulmonary:      Effort: Pulmonary effort is normal. No respiratory distress.   Musculoskeletal:      Right ankle:  Swelling present. No deformity or ecchymosis. Tenderness present. Decreased range of motion.      Right foot: Normal.      Comments: Tenderness and swelling to lateral right ankle.  Range of motion decreased secondary to pain.  No deformities noted.  No wounds noted.   Skin:     General: Skin is warm and dry.      Capillary Refill: Capillary refill takes less than 2 seconds.   Neurological:      General: No focal deficit present.      Mental Status: She is alert and oriented to person, place, and time.          Procedures     Procedures     Orders Placed This Encounter   Procedures    XR ANKLE RIGHT (MIN 3 VIEWS)    [facility] [employer], [address]        Medications given during this emergency department visit:  Medications - No data to display    New Prescriptions    No medications on file        Past Medical History:   Diagnosis Date    Allergies     Anxiety     Bilateral calf pain 09/01/2020    Bladder problem     Carpal tunnel syndrome     Class 3 severe obesity without serious comorbidity with body mass index  (BMI) of 40.0 to 44.9 in adult [facility]) 09/01/2020    Depression     Encounter to establish care 09/01/2020    Headache     Hypertension     Intractable migraine without aura and without status migrainosus 03/15/2021    Panic attack 1997    Tinnitus of both ears 09/01/2020        Past Surgical History:   Procedure Laterality Date    COLONOSCOPY      LUMBAR FUSION  2002    patient denies this surgery on 11/09/20    PELVIC LAPAROSCOPY  2002    TONSILLECTOMY      WISDOM TOOTH EXTRACTION Bilateral         Results for orders placed or performed during the hospital encounter of 10/29/22   XR ANKLE RIGHT (MIN 3 VIEWS)    Narrative    Right Ankle    INDICATION: Right ankle pain and swelling    Three views of the right ankle were obtained    FINDINGS: There is no evidence of fracture or other acute bony abnormality.  There are no bony lesions. The ankle mortise is intact.       Impression    Negative right ankle          XR ANKLE RIGHT (MIN 3 VIEWS)   Final Result   Negative right ankle               Voice dictation software was used during the making of this note.  This software is not perfect and grammatical and other typographical errors may be present.  This note has not been completely proofread for errors.        Lucille Passy, APRN - [place]  10/29/22 1754

## 2022-10-29 NOTE — ED Triage Notes (Signed)
Pt arrived via POV c/o rt ankle pain. Pt injured rt ankle a year prior and has had worsening pain.

## 2022-11-14 ENCOUNTER — Encounter: Admit: 2022-11-14 | Discharge: 2022-11-14 | Attending: Orthopaedic Surgery | Primary: Internal Medicine

## 2022-11-14 ENCOUNTER — Ambulatory Visit: Admit: 2022-11-14 | Discharge: 2022-11-14 | Primary: Internal Medicine

## 2022-11-14 DIAGNOSIS — M25571 Pain in right ankle and joints of right foot: Secondary | ICD-10-CM

## 2022-11-14 MED ORDER — PREDNISONE 5 MG (48) PO TBPK
5 | ORAL | 2 refills | Status: DC
Start: 2022-11-14 — End: 2024-10-14

## 2022-11-14 NOTE — Progress Notes (Signed)
Name: Karen Logan  Date of Birth: 1981/03/21  Gender: female  MRN: [id number]    CC: Right ankle pain    HPI:   Childhood multiple right ankle injuries playing tennis   2019: Right lateral ankle injury with intermittent lateral ankle pain/limitations  August 2023: Flare right lateral ankle pain  11/14/2022: Initial visit: Bilateral ankle pain    ROS/Meds/PSH/PMH/FH/SH: reviewed today    Tobacco:  reports that she has never smoked. She has never been exposed to tobacco smoke. She has never used smokeless tobacco.     Physical Examination:  Patient appears to be alert and oriented with acceptable appearance.  No obvious distress or SOB  CV: appears to have acceptable vascular color and capillary refill  Neuro: appears to have mostly intact light touch sensation   Skin: Right lateral ankle/hindfoot soft tissue swelling  MS: Standing: Very subtle cavovarus: Gait full  Right = lateral ankle to lateral foot soft tissue swelling paralleling peroneal tendons  Right = mild lateral ankle instability; 5/5 strength; no crepitance    XR: Right: Standing AP lateral mortise ankle plus AP oblique foot taken today with suspected prior fifth metatarsal apophyseal fracture with resultant nonunion at level of TMT  XR Impression:  As above      Reviewed Test/Records/Documents:   01/23/2022: Dr. 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 We explored treatment options.     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  She has had chronic ankle pain dating back to 2019, but concerns increased August 2023  I suspect she has chronic peroneal tendon tear that needs surgery    Advanced medical imaging: Right ankle/hindfoot MRI scan: Assess peroneal tendon tearing; assess suspected apophyseal fifth metatarsal nonunion    DME: Offered 3D boot: She has an ankle brace  We discussed care and protection  PT: No indication today  Orthotic/prosthetic: She will try Vasyli plus Hoke insoles       Medication - OTC meds prn: Prescribed:  Before taking any pill or using topical medication, I recommend the patient review all listed medication potential side effects via the Internet or pharmacy-provided handout to ensure patient safety and to understand and accept risks/complications of reported potential concerns.  I also recommend the patient review any concerns over medication sensitivities, reactions or medication compatibility with the pharmacist or if needed with primary care or internal medicine provider    Prednisone 5 mg pack; # 48: take per package insert; 2 refills      Surgical discussion: She understands my upper extremity debilities: We discussed suspected surgery involving:  Right lateral ankle reconstruction: Peroneal tendon reconstruction: Fifth metatarsal apophyseal resection, peroneal tendon advancement  Follow up: After MRI scan  Work status: Regular     This note was created using Systems analyst which may result in errors of speech and spelling recognition and word/phrase syntax errors.

## 2022-11-27 NOTE — Telephone Encounter (Signed)
Called and confirmed with patient that she is self pay and will not be using any insurance for 12/19 visit.    Patient aware that out of pocket self pay cost for the MRI is $475.20.

## 2022-11-28 ENCOUNTER — Encounter: Primary: Internal Medicine

## 2022-11-29 ENCOUNTER — Encounter: Admit: 2022-11-29 | Discharge: 2022-11-29 | Attending: Psychiatry | Primary: Internal Medicine

## 2022-11-29 ENCOUNTER — Ambulatory Visit: Primary: Internal Medicine

## 2022-11-29 DIAGNOSIS — F410 Panic disorder [episodic paroxysmal anxiety] without agoraphobia: Secondary | ICD-10-CM

## 2022-11-29 DIAGNOSIS — F331 Major depressive disorder, recurrent, moderate: Secondary | ICD-10-CM

## 2022-11-29 MED ORDER — SERTRALINE HCL 100 MG PO TABS
100 MG | ORAL_TABLET | Freq: Every day | ORAL | 3 refills | Status: DC
Start: 2022-11-29 — End: 2023-04-19

## 2022-11-29 MED ORDER — DULOXETINE HCL 60 MG PO CPEP
60 MG | ORAL_CAPSULE | Freq: Every day | ORAL | 3 refills | Status: AC
Start: 2022-11-29 — End: 2023-04-19

## 2022-11-29 MED ORDER — BUPROPION HCL ER (XL) 300 MG PO TB24
300 MG | ORAL_TABLET | Freq: Every day | ORAL | 3 refills | Status: DC
Start: 2022-11-29 — End: 2023-04-19

## 2022-11-29 MED ORDER — CLONAZEPAM 0.5 MG PO TABS
0.5 MG | ORAL_TABLET | Freq: Three times a day (TID) | ORAL | 3 refills | Status: AC | PRN
Start: 2022-11-29 — End: 2023-03-29

## 2022-11-29 NOTE — Telephone Encounter (Signed)
Tried calling pt as she rescheduled her MRI and I am not sure the results will be back by the time of her results apt. Wanted to give her the option to call to see if results were in prior to leaving for her apt or rescheduling her MRI results apt. Was unable to LM.

## 2022-11-29 NOTE — Progress Notes (Unsigned)
Patient:  Karen Logan  Age:  41 y.o.  DOB:  [birth year]     SEX:  female MRN:  [id number]     RACE: American [place] / [place] Native     SEEN:  [x]   PATIENT  []   SPOUSE []   OTHER:                  11/29/2022    12:13 PM 09/05/2022     3:34 PM 08/09/2022     2:50 PM   PHQ-9    Little interest or pleasure in doing things 3 0 2   Feeling down, depressed, or hopeless 3 0 3   Trouble falling or staying asleep, or sleeping too much 2 0 3   Feeling tired or having little energy 3 0 3   Poor appetite or overeating 0 0 3   Feeling bad about yourself - or that you are a failure or have let yourself or your family down 3 0 3   Trouble concentrating on things, such as reading the newspaper or watching television 3 0 3   Moving or speaking so slowly that other people could have noticed. Or the opposite - being so fidgety or restless that you have been moving around a lot more than usual 1 0 3   Thoughts that you would be better off dead, or of hurting yourself in some way 0 0 0   PHQ-2 Score 6 0 5   PHQ-9 Total Score 18 0 23   If you checked off any problems, how difficult have these problems made it for you to do your work, take care of things at home, or get along with other people? 2 0 2           11/29/2022    12:19 PM 09/05/2022     3:34 PM 08/06/2022     3:32 PM   GAD-7 SCREENING   Feeling nervous, anxious, or on edge Nearly every day Not at all Nearly every day   Not being able to stop or control worrying Nearly every day Not at all More than half the days   Worrying too much about different things Nearly every day Not at all More than half the days   Trouble relaxing Several days Not at all Nearly every day   Being so restless that it is hard to sit still Nearly every day Not at all Nearly every day   Becoming easily annoyed or irritable Not at all Not at all Several days   Feeling afraid as if something awful might happen Nearly every day Not at all Nearly every day   GAD-7 Total Score 16 0 17   How difficult  have these problems made it for you to do your work, take care of things at home, or get along with other people? Very difficult Not difficult at all Extremely difficult            I was {location home office other:42238::"at home"} while conducting this encounter.    Consent:  She and/or her healthcare decision maker is aware that this patient-initiated Telehealth encounter is a billable service, with coverage as determined by her insurance carrier. She is aware that she may receive a bill and has provided verbal consent to proceed: {YES/NO/NA-Consent obtained within past 12 months:42122}Patient identification was verified, and a caregiver was present when appropriate. The patient was located in a state where the provider was credentialed to provide care.    This  virtual visit was conducted {VIRTUAL IONG:29528}    Total Time: {minutes:42232::"5-10 minutes"}.    Chief complaint:  Pt says she is still does not have the get up and go.    Subjective:  Seen virtually for follow-up.  Patient states she is doing as best as she can.  She still cannot get over the feeling of not being able to physically get up and go stuff.  Has been taking her medications regularly.  States its like an another person in her holding her down.  Still trying to find a job and has had Million interviews online and personal with no avail.  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  Has at least 3 interviews every day.  Does not understand why she cannot get a job.  Her PCP is weaning her off of her blood pressure medication because she has been having nausea.  Explained to her that Cymbalta can cause nausea.  Advised her to take medication after breakfast in the morning.  States she feels like since after she started taking Deplin has been having stomach issues.  Agrees to go off of Deplin and see if it helps.  States she has to have work because of inflation and property taxes going up.   Husband's job has been going well.  They get along good.  Supportive psychotherapy provided.  Patient denies suicidal ideation/homicidal ideations.  Denies symptoms of psychosis      Patient Active Problem List   Diagnosis    Depression    Hypertension    Class 3 severe obesity without serious comorbidity with body mass index (BMI) of 40.0 to 44.9 in adult [facility])    Tinnitus of both ears    Intractable migraine without aura and without status migrainosus    Chronic bilateral low back pain without sciatica    Chronic neck pain     LMP 11/20/2022, Birth control,   Not pregnant    Denies palpitation,SOB, Chest pain, headaches. In no acute distress.     There were no vitals taken for this visit.      MEDICATION REVIEW:    Current Medications:    Current Outpatient Medications   Medication Sig    Omega-3 Fatty Acids (OMEGA 3 PO) Take 1,000 mg by mouth daily    predniSONE 5 MG (48) TBPK Take as directed per package instructions    DULoxetine (CYMBALTA) 60 MG extended release capsule Take 1 capsule by mouth daily (with breakfast)    sertraline (ZOLOFT) 100 MG tablet Take 2 tablets by mouth every morning    clonazePAM (KLONOPIN) 1 MG tablet Take 0.5 tablets by mouth 2 times daily as needed for Anxiety for up to 120 days.    buPROPion (WELLBUTRIN XL) 300 MG extended release tablet Take 1 tablet by mouth every morning    hydroCHLOROthiazide (HYDRODIURIL) 25 MG tablet Take 1 tablet by mouth once daily    MAGNESIUM PO Take by mouth    Prenatal Vit-Fe Fumarate-FA (PRENATAL PO) Take by mouth    methylfolate (DEPLIN) 7.5 MG TABS tablet Take 7.5 tablets by mouth daily    vitamin D3 (CHOLECALCIFEROL) 125 MCG (5000 UT) TABS tablet Take by mouth daily    Melatonin 5 MG CAPS Take by mouth     No current facility-administered medications for this visit.       Allergies   Allergen Reactions    Levonorgestrel-Ethinyl Estrad Itching     Denies allergy  02/22/22    Hydrocodone-Acetaminophen Nausea Only and Swelling       Past Medical History,  Past Surgical History, Family history, Social History, and Medications were all reviewed with the patient today and updated as necessary.     Compliant with medication: {YES / NO:19727}   Side effects from medications:  {YES / NO:19727}           No data to display                   EXAMINATION  Musculoskeletal    GAIT AND STATION   []  WNL   []  RESTRICTED   []  UNSTEADY WALK        []  ABNORMAL   []  UNBALANCED  []  WHEELCHAIR/  WALKER/CANE     PSYCHIATRIC    PSYCHOMOTOR   [x]   WNL   []  RETARDATION   []  AGITATION            GENERAL APPEARANCE:   [x]   WELL GROOMED []      DISHEVELED   []   UNKEMPT      []   UNUSUAL/BIZZARE    []  WNL       ATTITUDE:   [x]  COOPERATIVE   []  GUARDED   []  SUSPICIOUS      []  HOSTILE                            BEHAVIOR:   [x]  CALM   []  HYPERACTIVE   []  MANNERISMS      []  BIZZARE         SPEECH:   [x]  NORMAL FOR CLIENT   []  SPONTANEOUS   []  SLURRED   []  WHISPERING      []  LOUD   []  PRESSURED   []  ARTICULATE        EYE CONTACT:   [x]  WNL   []  BLANK STARE   []  INTENSE      []  AVOIDANT         MOOD:   [x]  EUTHYMIC   []  ANXIOUS   []  DEPRESSED      []  IRRITABLE   []  ANGRY   []  APATHETIC     AFFECT:   [x]  CONGRUENT WITH MOOD   []  FLAT   []  CONSTRICTED      []  INAPPROPRIATE   []  LABILE           THOUGHT PROCESS:   [x]  LOGICAL/GOAL-DIRECTED   []  FOI   []  CIRCUMSANTIAL      []  INCOHERENT   []  TANGENTIAL   []  CONCRETE      []  PERSEVERATION           THOUGHT CONTENT:                DELUSIONS  []  DENIES  []  GRANDIOSE  []  PERSECUTORY  []  RELIGIOUS  []  REFERENCE   HALLUCINATIONS  []  DENIES  []  AUDITORY  []  VISUAL  []  OLFACTORY  []  TACTILE     []  GUSTATORY  []  SOMATIC         OBSESSIONS  []  DENIES  []  PRESENT         SUICIDAL IDEATION  []  DENIES  []  PRESENT W/O PLAN  []  PRESENT W/ PLAN       HOMICIDAL IDEATION  []  DENIES  []  PRESENT W/O PLAN  []  PRESENT W/ PLAN           JUDGEMENT:   []  GOOD   [x]  FAIR   []   POOR   INSIGHT:   []  GOOD   []  FAIR   []  POOR     COGNITION:           SENSORIUM:   []  ALERT   []  CLOUDED    []  DROWSY     ORIENTATION:   [x]  INTACT   []  TIME:  PLACE  PERSON   RECENT & REMOTE MEMORY:   [x]  NORMAL   [x]  OTHER:                  ATTENTION:   [x]  INTACT   []  MILD IMPAIRMENT   []  SEVERE IMPAIRMENT     CONCENTRATION:   []  INTACT   []  MILD IMPAIRMENT   []  SEVERE IMPAIRMENT     LANGUAGE:   []  AVERAGE   []  ABOVE AVERAGE   []  BELOW AVERAGE     FUND OF KNOWLEDGE:   []  UNABLE TO ASSESS AT THIS TIME   []  AVERAGE   []  ABOVE AVERAGE   []  BELOW AVERAGE      []  GOOD TO EXCELLENT KNOWLEDGE OF CURRENT EVENTS   []  POOR TO NO KNLEDGE OF CURRENT EVENTS                                          ABNORMAL MOVEMENTS:   [x]  NONE   []  TICS   []  TREMORS   []  BIZZARE      []  FACE   []  TRUNK   []  EXTREMETIES   []  GESTURES        SLEEP:   [x]  GOOD   []  FAIR   []  POOR      APPETITE:   [x]  GOOD   []  POOR   []  ERRATIC       MUSCLE STRENGTH AND TONE   [x]  WNL   []  ATROPHY   []  SPASTIC        []  FLACCID   []  COGWHEEL       Diagnoses/Impressions:  No diagnosis found.    TREATMENT GOALS:  Symptom reduction, Medication adherence, maintain therapeutic gains    LABS/IMAGING:    []   Ordered [x]   Reviewed []   New Labs Ordered:     LAB  WBC   Date/Time Value Ref Range Status   02/21/2021 03:22 PM 9.1 4.3 - 11.1 K/uL Final     Hemoglobin   Date/Time Value Ref Range Status   02/21/2021 03:22 PM 12.1 11.7 - 15.4 g/dL Final     Hematocrit   Date/Time Value Ref Range Status   02/21/2021 03:22 PM 37.8 35.8 - 46.3 % Final     Platelets   Date/Time Value Ref Range Status   02/21/2021 03:22 PM 323 150 - 450 K/uL Final     Sodium   Date/Time Value Ref Range Status   02/21/2021 03:22 PM 141 136 - 145 mmol/L Final     Potassium   Date/Time Value Ref Range Status   02/21/2021 03:22 PM 3.7 3.5 - 5.1 mmol/L Final     Chloride   Date/Time Value Ref Range Status   02/21/2021 03:22 PM 106 98 - 107 mmol/L Final     CO2   Date/Time Value Ref Range Status   02/21/2021 03:22 PM 29 21 - 32 mmol/L Final     BUN   Date/Time Value Ref Range Status   02/21/2021 03:22 PM 7 6 -  23  MG/DL Final     ALT   Date/Time Value Ref Range Status   03/15/2021 02:19 PM 10 0 - 32 IU/L Final     AST   Date/Time Value Ref Range Status   03/15/2021 02:19 PM 14 0 - 40 IU/L Final     TSH   Date/Time Value Ref Range Status   09/01/2020 03:02 PM 0.818 0.450 - 4.500 uIU/mL Final       Please refer to the lab tab in the epic and care everywhere for the most recent lab results.    Plan:     [x]   Medication ordered:     [x]   Medication education/counseling provided  Medication dosage and time to take, purpose/expected benefits/risks, common side effects, lab monitoring required and reason, expected length of treatment, risk of no treatment, effects on pregnancy/nursing, financial availability discussed. Educated patient on  side effects/risks/benefits of meds including metabolic syndrome risk, EPS, increased risk of cerebrovascular accidents and mortality in elderly, akathisia,  cardiac arrhythmias, suicidal ideations, priapism, orthostatic hypotension, serotonin syndrome, risk of mania/hypomania from antidepressants, withdrawals from abrupt discontinuation of meds, risk of rash, risk of infection, risk of bleeding, risk of seizures, addiction potential, memory impairment with long term use of benzos, respiratory depression, high blood pressure, dizziness, drowsiness, sedation , risk of falls, Risk & benefits discussed: including but not limited to possible off-label medication usage.     [x]  Patient encouraged to contact the clinic if experiencing any adverse reactions with medications.      [x]  Follow MSE for sxs improvement     I have reviewed the patient's controlled substance prescription history, as maintained in the [place] prescription monitoring program, so that the prescription(s) for a  controlled substance can be given.    Recommendations and Referrals:    Follow up with : MD, requires monitoring of response to medication, requires monitoring of medication side effects.    Time until next PMA: 3  months/1 month/2 weeks    Follow up with Mental Health Clinicians recommended for : psychotherapy interventions,  monitoring to prevent decompensation /hospitalization, monitoring to maintain therapeutic gains, monitoring symptoms (resolving and controlled), to improve level of functioning,         Psychotherapy note:                                __10_ Minutes of psychotherapy     [x]   Supportive psychotherapy provided to improve self-esteem, psychological functioning, and adaptive skills. Patient discussed certain situational and personal stressors ongoing in his/her life at this time, weight management d/w the patient. Sleep hygiene d/w patient. Patient allowed to vent out his/her emotions.  Scenarios were reviewed using role playing, CBT  techniques, and through exploration and interpretation of the patient's behavior  in order to increase insight and decrease anxiety.    Dysfunctional cognitions and the resulting problematic behavior addressed and alternate options dicussed.      []   Disposition planning      []   Dangerous and will not contract for safety in the community    **[place] has been notified: They are to call 911 or go to their nearest E.R. if they are experiencing a medical emergency or suicidal ideations/homicidal ideations.**  All ancillary documentation entered reviewed by provider.      PLEASE NOTE:  This document has been produced in part or whole using voice recognition software. Proofread however unrecognized errors in transcription may  be present.    Daivd Council, MD    Marlynn Perking, was evaluated through a synchronous (real-time) audio-video encounter. The patient (or guardian if applicable) is aware that this is a billable service, which includes applicable co-pays. This Virtual Visit was conducted with patient's (and/or legal guardian's) consent. Patient identification was verified, and a caregiver was present when appropriate.   The patient was located at {Telehealth POS -  Patient:[id number]}  Provider was located at {Telehealth POS - Provider:[id number]}  {STOP! Confirm you are appropriately licensed, registered, or certified to deliver care in the state where the patient is located as indicated above. If you are not or unsure, please re-schedule the visit. (Optional):63966}       Total time spent for this encounter: {Time Spent:21065000}    --Daivd Council, MD on 11/29/2022 at 1:41 PM    An electronic signature was used to authenticate this note.

## 2022-11-30 ENCOUNTER — Encounter: Attending: Orthopaedic Surgery | Primary: Internal Medicine

## 2022-12-19 ENCOUNTER — Encounter

## 2022-12-19 MED ORDER — HYDROCHLOROTHIAZIDE 25 MG PO TABS
25 MG | ORAL_TABLET | Freq: Every day | ORAL | 1 refills | Status: AC
Start: 2022-12-19 — End: 2023-07-09

## 2022-12-19 NOTE — Telephone Encounter (Signed)
As of last office note (09/05/2022), patient is still taking this medication however patient does not have a follow up appointment scheduled. Pended medication has correct quantity and pended to preferred pharmacy.

## 2023-02-07 ENCOUNTER — Encounter: Attending: Psychiatry | Primary: Internal Medicine

## 2023-04-19 ENCOUNTER — Telehealth: Admit: 2023-04-19 | Discharge: 2023-04-19 | Attending: Psychiatry | Primary: Internal Medicine

## 2023-04-19 DIAGNOSIS — F3341 Major depressive disorder, recurrent, in partial remission: Secondary | ICD-10-CM

## 2023-04-19 DIAGNOSIS — F411 Generalized anxiety disorder: Secondary | ICD-10-CM

## 2023-04-19 MED ORDER — CLONAZEPAM 0.5 MG PO TABS
0.5 | ORAL_TABLET | Freq: Three times a day (TID) | ORAL | 3 refills | Status: DC | PRN
Start: 2023-04-19 — End: 2023-08-02

## 2023-04-19 MED ORDER — L-METHYLFOLATE 7.5 MG PO TABS
7.5 | ORAL_TABLET | Freq: Every day | ORAL | 2 refills | Status: DC
Start: 2023-04-19 — End: 2024-10-14

## 2023-04-19 MED ORDER — DULOXETINE HCL 30 MG PO CPEP
30 MG | ORAL_CAPSULE | ORAL | 0 refills | Status: DC
Start: 2023-04-19 — End: 2024-02-05

## 2023-04-19 MED ORDER — SERTRALINE HCL 100 MG PO TABS
100 | ORAL_TABLET | Freq: Every day | ORAL | 3 refills | Status: DC
Start: 2023-04-19 — End: 2023-08-02

## 2023-04-19 MED ORDER — BUPROPION HCL ER (XL) 300 MG PO TB24
300 | ORAL_TABLET | Freq: Every day | ORAL | 3 refills | Status: DC
Start: 2023-04-19 — End: 2023-08-02

## 2023-04-19 NOTE — Progress Notes (Signed)
Patient:  Karen Logan  Age:  42 y.o.  DOB:  March 02, 1981     SEX:  female MRN:  [id number]     RACE: American [place] / [place] Native     SEEN:  [x]   PATIENT  []   SPOUSE []   OTHER:                  04/19/2023     9:14 AM 11/29/2022    12:13 PM 09/05/2022     3:34 PM   PHQ-9    Little interest or pleasure in doing things 1 3 0   Feeling down, depressed, or hopeless 0 3 0   Trouble falling or staying asleep, or sleeping too much 1 2 0   Feeling tired or having little energy 1 3 0   Poor appetite or overeating 0 0 0   Feeling bad about yourself - or that you are a failure or have let yourself or your family down 0 3 0   Trouble concentrating on things, such as reading the newspaper or watching television 1 3 0   Moving or speaking so slowly that other people could have noticed. Or the opposite - being so fidgety or restless that you have been moving around a lot more than usual 0 1 0   Thoughts that you would be better off dead, or of hurting yourself in some way 0 0 0   PHQ-2 Score 1 6 0   PHQ-9 Total Score 4 18 0   If you checked off any problems, how difficult have these problems made it for you to do your work, take care of things at home, or get along with other people? 1 2 0           04/19/2023     9:16 AM 11/29/2022    12:19 PM 09/05/2022     3:34 PM   GAD-7 SCREENING   Feeling nervous, anxious, or on edge Not at all Nearly every day Not at all   Not being able to stop or control worrying Several days Nearly every day Not at all   Worrying too much about different things Several days Nearly every day Not at all   Trouble relaxing Not at all Several days Not at all   Being so restless that it is hard to sit still Several days Nearly every day Not at all   Becoming easily annoyed or irritable Not at all Not at all Not at all   Feeling afraid as if something awful might happen Not at all Nearly every day Not at all   GAD-7 Total Score 3 16 0   How difficult have these problems made it for you to do your  work, take care of things at home, or get along with other people? Not difficult at all Very difficult Not difficult at all            I was in the office while conducting this encounter.    Consent:  She and/or her healthcare decision maker is aware that this patient-initiated Telehealth encounter is a billable service, with coverage as determined by her insurance carrier. She is aware that she may receive a bill and has provided verbal consent to proceed: YesPatient identification was verified, and a caregiver was present when appropriate. The patient was located in a state where the provider was credentialed to provide care.    This virtual visit was conducted via MyChart. Pursuant to the emergency declaration  under the [employer] and the [employer], 1135 waiver authority and the [employer] Act, this Virtual  Visit was conducted to reduce the patient's risk of exposure to COVID-19 and provide continuity of care for an established patient. Services were provided through a video synchronous discussion virtually to substitute for in-person clinic visit.  Due to this being a TeleHealth evaluation, many elements of the physical examination are unable to be assessed.     Total Time: minutes: 11-20 minutes.    Chief complaint:  Pt says she has been stressed out because no job.    Subjective:  Seen virtually for follow-up.  States has been stressed out because no job.  Went to work at [employer] and it did not work out.  She was still in training, nervous, New, kept to herself, stayed quiet.  There was some issues with the computer program she was learning.  Feels like she is not good enough for the carrier she is in.  Dysfunctional cognitions challenged and alternate thought options discussed.  States she has been sweating a lot on medication.  Her primary care doctor is adjusting her blood pressure medications.  Sweating a lot in her head so much so  that it feels like somebody is holding a bucket of water on her head.  Explained to her that Cymbalta and SSRIs can cause sweating.  Will taper her off of the Cymbalta.  Taper off explained.  Continue Zoloft and Wellbutrin at the current doses.  Her GeneSight testing results reviewed.  States her blood pressure has been fine.  Consider prazosin after also seen if excessive sweating continues even after taper off of the Cymbalta.  Supportive psychotherapy provided.  Patient denies suicidal ideation/homicidal ideations.  Denies symptoms of psychosis.      Patient Active Problem List   Diagnosis    Depression    Hypertension    Class 3 severe obesity without serious comorbidity with body mass index (BMI) of 40.0 to 44.9 in adult [facility])    Tinnitus of both ears    Intractable migraine without aura and without status migrainosus    Chronic bilateral low back pain without sciatica    Chronic neck pain     LMP 03/12/2023, Birth control,   Not pregnant    Denies palpitation,SOB, Chest pain, headaches. In no acute distress.     There were no vitals taken for this visit.      MEDICATION REVIEW:    Current Medications:    Current Outpatient Medications   Medication Sig    Melatonin 1 MG CHEW Take by mouth    buPROPion (WELLBUTRIN XL) 300 MG extended release tablet Take 1 tablet by mouth daily (with breakfast)    clonazePAM (KLONOPIN) 0.5 MG tablet Take 1 tablet by mouth 3 times daily as needed for Anxiety for up to 120 days. Max Daily Amount: 1.5 mg    DULoxetine (CYMBALTA) 30 MG extended release capsule Take 30 mg once daily for one month then 30 mg once every other day for 1 month and then discontinue    methylfolate (DEPLIN) 7.5 MG TABS tablet Take 7.5 tablets by mouth daily    sertraline (ZOLOFT) 100 MG tablet Take 2 tablets by mouth daily (with breakfast)    hydroCHLOROthiazide (HYDRODIURIL) 25 MG tablet Take 1 tablet by mouth daily    Omega-3 Fatty Acids (OMEGA 3 PO) Take 1,000 mg by mouth daily    predniSONE 5 MG (48) TBPK  Take as directed per package instructions    MAGNESIUM PO Take by mouth    Prenatal Vit-Fe Fumarate-FA (PRENATAL PO) Take by mouth    vitamin D3 (CHOLECALCIFEROL) 125 MCG (5000 UT) TABS tablet Take by mouth daily    Melatonin 5 MG CAPS Take by mouth     No current facility-administered medications for this visit.       Allergies   Allergen Reactions    Levonorgestrel-Ethinyl Estrad Itching     Denies allergy 02/22/22    Hydrocodone-Acetaminophen Nausea Only and Swelling       Past Medical History, Past Surgical History, Family history, Social History, and Medications were all reviewed with the patient today and updated as necessary. Where available I have reviewed outside charts in epic and I have referenced care everywhere where possible.     Compliant with medication: Yes   Side effects from medications:  No           No data to display                   EXAMINATION  Musculoskeletal    GAIT AND STATION   []  WNL   []  RESTRICTED   []  UNSTEADY WALK        []  ABNORMAL   []  UNBALANCED  []  WHEELCHAIR/  WALKER/CANE     PSYCHIATRIC    PSYCHOMOTOR   [x]   WNL   []  RETARDATION   []  AGITATION            GENERAL APPEARANCE:   []   WELL GROOMED []      DISHEVELED   []   UNKEMPT      []   UNUSUAL/BIZZARE    [x]  WNL       ATTITUDE:   [x]  COOPERATIVE   []  GUARDED   []  SUSPICIOUS      []  HOSTILE                            BEHAVIOR:   [x]  CALM   []  HYPERACTIVE   []  MANNERISMS      []  BIZZARE         SPEECH:   [x]  NORMAL FOR CLIENT   []  SPONTANEOUS   []  SLURRED   []  WHISPERING      []  LOUD   []  PRESSURED   []  ARTICULATE        EYE CONTACT:   [x]  WNL   []  BLANK STARE   []  INTENSE      []  AVOIDANT         MOOD:   [x]  EUTHYMIC   []  ANXIOUS   []  DEPRESSED      []  IRRITABLE   []  ANGRY   []  APATHETIC     AFFECT:   [x]  CONGRUENT WITH MOOD   []  FLAT   []  CONSTRICTED      []  INAPPROPRIATE   []  LABILE           THOUGHT PROCESS:   [x]  LOGICAL/GOAL-DIRECTED   []  FOI   []  CIRCUMSANTIAL      []  INCOHERENT   []  TANGENTIAL   []  CONCRETE      []   PERSEVERATION           THOUGHT CONTENT:                DELUSIONS  [x]  DENIES  []  GRANDIOSE  []  PERSECUTORY  []  RELIGIOUS  []  REFERENCE   HALLUCINATIONS  [x]  DENIES  []   AUDITORY  []  VISUAL  []  OLFACTORY  []  TACTILE     []  GUSTATORY  []  SOMATIC         OBSESSIONS  [x]  DENIES  []  PRESENT         SUICIDAL IDEATION  [x]  DENIES  []  PRESENT W/O PLAN  []  PRESENT W/ PLAN       HOMICIDAL IDEATION  [x]  DENIES  []  PRESENT W/O PLAN  []  PRESENT W/ PLAN           JUDGEMENT:   [x]  GOOD   []  FAIR   []  POOR   INSIGHT:   [x]  GOOD   []  FAIR   []  POOR     COGNITION:           SENSORIUM:   [x]  ALERT   []  CLOUDED   []  DROWSY     ORIENTATION:   [x]  INTACT   []  TIME:  PLACE  PERSON   RECENT & REMOTE MEMORY:   [x]  NORMAL   []  OTHER:                  ATTENTION:   []  INTACT   [x]  MILD IMPAIRMENT   []  SEVERE IMPAIRMENT     CONCENTRATION:   []  INTACT   [x]  MILD IMPAIRMENT   []  SEVERE IMPAIRMENT     LANGUAGE:   [x]  AVERAGE   []  ABOVE AVERAGE   []  BELOW AVERAGE     FUND OF KNOWLEDGE:   []  UNABLE TO ASSESS AT THIS TIME   [x]  AVERAGE   []  ABOVE AVERAGE   []  BELOW AVERAGE      []  GOOD TO EXCELLENT KNOWLEDGE OF CURRENT EVENTS   []  POOR TO NO KNLEDGE OF CURRENT EVENTS                                          ABNORMAL MOVEMENTS:   [x]  NONE   []  TICS   []  TREMORS   []  BIZZARE      []  FACE   []  TRUNK   []  EXTREMETIES   []  GESTURES        SLEEP:   []  GOOD   [x]  FAIR   []  POOR      APPETITE:   [x]  GOOD   []  POOR   []  ERRATIC       MUSCLE STRENGTH AND TONE   []  WNL   []  ATROPHY   []  SPASTIC        []  FLACCID   []  COGWHEEL       Diagnoses/Impressions:    ICD-10-CM    1. Recurrent major depressive disorder, in partial remission (HCC)  F33.41       2. Generalized anxiety disorder with panic attacks  F41.1 clonazePAM (KLONOPIN) 0.5 MG tablet    F41.0       3. Primary insomnia  F51.01 clonazePAM (KLONOPIN) 0.5 MG tablet      4. Psychosocial stressors  Z65.8           TREATMENT GOALS:  Symptom reduction, Medication adherence, maintain therapeutic  gains    LABS/IMAGING:    []   Ordered [x]   Reviewed []   New Labs Ordered:     LAB  WBC   Date/Time Value Ref Range Status   02/21/2021 03:22 PM 9.1 4.3 - 11.1 K/uL Final     Hemoglobin   Date/Time Value Ref Range Status  02/21/2021 03:22 PM 12.1 11.7 - 15.4 g/dL Final     Hematocrit   Date/Time Value Ref Range Status   02/21/2021 03:22 PM 37.8 35.8 - 46.3 % Final     Platelets   Date/Time Value Ref Range Status   02/21/2021 03:22 PM 323 150 - 450 K/uL Final     Sodium   Date/Time Value Ref Range Status   02/21/2021 03:22 PM 141 136 - 145 mmol/L Final     Potassium   Date/Time Value Ref Range Status   02/21/2021 03:22 PM 3.7 3.5 - 5.1 mmol/L Final     Chloride   Date/Time Value Ref Range Status   02/21/2021 03:22 PM 106 98 - 107 mmol/L Final     CO2   Date/Time Value Ref Range Status   02/21/2021 03:22 PM 29 21 - 32 mmol/L Final     BUN   Date/Time Value Ref Range Status   02/21/2021 03:22 PM 7 6 - 23 MG/DL Final     ALT   Date/Time Value Ref Range Status   03/15/2021 02:19 PM 10 0 - 32 IU/L Final     AST   Date/Time Value Ref Range Status   03/15/2021 02:19 PM 14 0 - 40 IU/L Final     TSH   Date/Time Value Ref Range Status   09/01/2020 03:02 PM 0.818 0.450 - 4.500 uIU/mL Final       Please refer to the lab tab in the epic and care everywhere for the most recent lab results.    Plan:     [x]   Medication ordered: Taper off of Cymbalta explained.  Continue Zoloft, Wellbutrin, Klonopin, L-methylfolate to target depression, anxiety, insomnia.    [x]   Medication education/counseling provided  Medication dosage and time to take, purpose/expected benefits/risks, common side effects, lab monitoring required and reason, expected length of treatment, risk of no treatment, effects on pregnancy/nursing, financial availability discussed. Educated patient on  side effects/risks/benefits of meds including cardiac arrhythmias, suicidal ideations, orthostatic hypotension, serotonin syndrome, risk of mania/hypomania from  antidepressants, withdrawals from abrupt discontinuation of meds, liver toxicity, risk of bleeding, risk of seizures, addiction potential, memory impairment with long term use of benzos, respiratory depression, high blood pressure, dizziness, drowsiness, sedation , risk of falls, Risk & benefits discussed: including but not limited to possible off-label medication usage.     [x]  Patient encouraged to contact the clinic if experiencing any adverse reactions with medications.      [x]  Follow MSE for sxs improvement     I have reviewed the patient's controlled substance prescription history, as maintained in the [place] prescription monitoring program, so that the prescription(s) for a  controlled substance can be given.    Recommendations and Referrals:    Follow up with : MD, requires monitoring of response to medication, requires monitoring of medication side effects.    Time until next PMA: 3 months    Follow up with Mental Health Clinicians recommended for : psychotherapy interventions,  monitoring to prevent decompensation /hospitalization, monitoring to maintain therapeutic gains, monitoring symptoms (resolving and controlled), to improve level of functioning,         Psychotherapy note:                                __10_ Minutes of psychotherapy     [x]   Supportive psychotherapy provided to improve self-esteem, psychological functioning, and adaptive skills. Patient discussed certain  situational and personal stressors ongoing in her life at this time, weight management d/w the patient. Sleep hygiene d/w patient. Patient allowed to vent out her emotions.  Scenarios were reviewed using  CBT  techniques in order to increase insight and decrease anxiety.    Dysfunctional cognitions challenged and alternate options dicussed.      []   Disposition planning      []   Dangerous and will not contract for safety in the community    **Pateint has been notified: They are to call 911 or go to their nearest E.R. if  they are experiencing a medical emergency or suicidal ideations/homicidal ideations.**  All ancillary documentation entered reviewed by provider.      PLEASE NOTE:  This document has been produced in part or whole using voice recognition software. Proofread however unrecognized errors in transcription may be present.    Daivd Council, MD    Karen Logan, was evaluated through a synchronous (real-time) audio-video encounter. The patient (or guardian if applicable) is aware that this is a billable service, which includes applicable co-pays. This Virtual Visit was conducted with patient's (and/or legal guardian's) consent. Patient identification was verified, and a caregiver was present when appropriate.   The patient was located at Home: [address] [place] [address]  Provider was located at Facility (Appt Dept): [address]  [place],  [place] [address]  Confirm you are appropriately licensed, registered, or certified to deliver care in the state where the patient is located as indicated above. If you are not or unsure, please re-schedule the visit: Yes, I confirm.        Total time spent for this encounter: Not billed by time    --Daivd Council, MD on 04/20/2023 at 7:15 AM    An electronic signature was used to authenticate this note.

## 2023-05-16 ENCOUNTER — Encounter: Attending: Internal Medicine | Primary: Internal Medicine

## 2023-07-02 ENCOUNTER — Encounter: Attending: Psychiatry | Primary: Internal Medicine

## 2023-07-09 ENCOUNTER — Encounter

## 2023-07-09 NOTE — Telephone Encounter (Signed)
LOV 09/05/2022  NOV 07/25/2023

## 2023-07-10 MED ORDER — HYDROCHLOROTHIAZIDE 25 MG PO TABS
25 MG | ORAL_TABLET | Freq: Every day | ORAL | 1 refills | Status: DC
Start: 2023-07-10 — End: 2023-11-30

## 2023-07-25 ENCOUNTER — Ambulatory Visit: Admit: 2023-07-25 | Discharge: 2023-07-25 | Attending: Internal Medicine | Primary: Internal Medicine

## 2023-07-25 DIAGNOSIS — I10 Essential (primary) hypertension: Secondary | ICD-10-CM

## 2023-07-25 NOTE — Progress Notes (Signed)
[place] Zhania Shaheen, D.O.   [facility]  [address]  [place], [place] [address]  Tel: [phone]    Office Visit: Follow Up     Patient Name: Karen Logan   DOB:  12/23/1980   MRN:   [id number]      Today's Date: 07/25/23 11:54 AM    Subjective     The patient is a 42 y.o.-year-old female who presents for follow-up.    Primary hypertension  -Hydrochlorothiazide 25 mg daily     Anxiety and depression, well controlled   -Zoloft 200 mg daily  -Klonopin 0.5 mg 3 times daily as needed  -Wellbutrin XL 300 mg daily  -Patient is following with Dr. Sharol Given, last seen 04/19/2023.  -patient is following with psychiatry q3 months.   -patient states she is doing very well with her anxiety and depression.      Obesity  -BMI 34.61, weight 208 pounds, down 50 pounds since January 2023  -patient is eating healthy and exercising regularly.     Today:   No new concerns today.    Review of Systems   All other systems reviewed and are negative.     Past Medical History:   Diagnosis Date    Allergies     Anxiety     Anxiety and depression     Bilateral calf pain 09/01/2020    Bladder problem     Carpal tunnel syndrome     Chronic bilateral low back pain without sciatica 09/05/2022    Chronic neck pain 09/05/2022    Class 1 obesity due to excess calories with serious comorbidity and body mass index (BMI) of 34.0 to 34.9 in adult 07/25/2023    Class 3 severe obesity without serious comorbidity with body mass index (BMI) of 40.0 to 44.9 in adult [facility]) 09/01/2020    Depression     Encounter to establish care 09/01/2020    Headache     Hypertension     Intractable migraine without aura and without status migrainosus 03/15/2021    Panic attack 1997    Tinnitus of both ears 09/01/2020     Social History     Socioeconomic History    Marital status: Married     Spouse name: Not on file    Number of children: Not on file    Years of education: Not on file    Highest education level: Not on file   Occupational History    Not on  file   Tobacco Use    Smoking status: Never     Passive exposure: Never    Smokeless tobacco: Never   Vaping Use    Vaping status: Never Used   Substance and Sexual Activity    Alcohol use: Never    Drug use: Never    Sexual activity: Yes     Partners: Male     Comment: My husband   Other Topics Concern    Not on file   Social History Narrative    Not on file     Social Determinants of Health     Financial Resource Strain: Low Risk  (07/25/2023)    Overall Financial Resource Strain (CARDIA)     Difficulty of Paying Living Expenses: Not hard at all   Food Insecurity: No Food Insecurity (07/25/2023)    Hunger Vital Sign     Worried About Running Out of Food in the Last Year: Never true     Ran Out of Food  in the Last Year: Never true   Transportation Needs: Unknown (07/25/2023)    PRAPARE - Therapist, art (Medical): Not on file     Lack of Transportation (Non-Medical): No   Physical Activity: Not on file   Stress: Not on file   Social Connections: Not on file   Intimate Partner Violence: Not on file   Housing Stability: Unknown (07/25/2023)    Housing Stability Vital Sign     Unable to Pay for Housing in the Last Year: Not on file     Number of Times Moved in the Last Year: Not on file     Homeless in the Last Year: No         Current Outpatient Medications   Medication Sig    hydroCHLOROthiazide (HYDRODIURIL) 25 MG tablet Take 1 tablet by mouth daily    Melatonin 1 MG CHEW Take by mouth    buPROPion (WELLBUTRIN XL) 300 MG extended release tablet Take 1 tablet by mouth daily (with breakfast)    clonazePAM (KLONOPIN) 0.5 MG tablet Take 1 tablet by mouth 3 times daily as needed for Anxiety for up to 120 days. Max Daily Amount: 1.5 mg    methylfolate (DEPLIN) 7.5 MG TABS tablet Take 7.5 tablets by mouth daily    sertraline (ZOLOFT) 100 MG tablet Take 2 tablets by mouth daily (with breakfast)    Omega-3 Fatty Acids (OMEGA 3 PO) Take 1,000 mg by mouth daily    MAGNESIUM PO Take by mouth    Prenatal  Vit-Fe Fumarate-FA (PRENATAL PO) Take by mouth    vitamin D3 (CHOLECALCIFEROL) 125 MCG (5000 UT) TABS tablet Take by mouth daily    DULoxetine (CYMBALTA) 30 MG extended release capsule Take 30 mg once daily for one month then 30 mg once every other day for 1 month and then discontinue (Patient not taking: Reported on 07/25/2023)    predniSONE 5 MG (48) TBPK Take as directed per package instructions    Melatonin 5 MG CAPS Take by mouth     No current facility-administered medications for this visit.      Objective     Vitals:    07/25/23 1058   BP: 123/72   Site: Right Upper Arm   Position: Sitting   Cuff Size: Large Adult   Pulse: 64   Temp: 98 F (36.7 C)   SpO2: 99%   Weight: 94.3 kg (208 lb)   Height: 1.651 m (5\' 5" )      Physical Exam:  Constitutional: Appears well kempt. Alert/oriented x3. In no acute distress.  Head: Normocephalic No trauma. No deformity.   Cardiac: Heart with normal rate/rhythm. No murmurs. No gallops. Pulses normal.   Pulmonary: Lungs clear to auscultation bilaterally. In no respiratory distress. No wheezing. No rales. No rhonci.   Psychiatric: Normal thought content. Normal behavior. Normal judgment.     Recommendations     Assessment:  Patient Active Problem List   Diagnosis    Anxiety and depression    Hypertension    Class 3 severe obesity without serious comorbidity with body mass index (BMI) of 40.0 to 44.9 in adult [facility])    Tinnitus of both ears    Intractable migraine without aura and without status migrainosus    Class 1 obesity due to excess calories with serious comorbidity and body mass index (BMI) of 34.0 to 34.9 in adult      Status of above conditions: stable     Plan:  Overall impression: Patient is a very pleasant 42 year old female who presents for follow-up.  She is doing very well today with no acute complaints.  She states she has been working hard to lose weight and is down 50 pounds total.  She has no acute complaint today I did    Primary  hypertension  -Hydrochlorothiazide 25 mg daily     Anxiety and depression, well controlled   -Zoloft 200 mg daily  -Klonopin 0.5 mg 3 times daily as needed  -Wellbutrin XL 300 mg daily  -Patient is following with Dr. Sharol Given, last seen 04/19/2023.  -patient is following with psychiatry q3 months.      Obesity  -BMI 34.61, weight 208 pounds, down 50 pounds since January 2023  -Continue diet and exercise    Health maintenance  -Mammogram due  -Pap smear due  -Colonoscopy due 09/26/2026    -Previous medical records and/or labs/tests available to me reviewed, any records outstanding not available requested  -The risks, benefits, and medical necessity of all medications, tests labs, and any other orders that were ordered at today's visit were discussed with the patient including all elective or patient requested orders.  Decision to order or not order tests are based on this risk/benefit ratio and medical necessity.  -The patient expresses understanding of the plan as I've explained it to her and is in agreement with the current plan.    Follow Up: 1 year for annual physical    Total Time: 30 minutes (chart reviewing and in-exam time)    Signed: [place] Zoei Amison, D.O.  07/25/23  11:54 AM

## 2023-07-26 LAB — CBC WITH AUTO DIFFERENTIAL
Basophils %: 1 % (ref 0.0–2.0)
Basophils Absolute: 0.1 10*3/uL (ref 0.0–0.2)
Eosinophils %: 3 % (ref 0.5–7.8)
Eosinophils Absolute: 0.3 10*3/uL (ref 0.0–0.8)
Hematocrit: 36.8 % (ref 35.8–46.3)
Hemoglobin: 11.8 g/dL (ref 11.7–15.4)
Immature Granulocytes %: 0 % (ref 0.0–5.0)
Immature Granulocytes Absolute: 0 10*3/uL (ref 0.0–0.5)
Lymphocytes %: 29 % (ref 13–44)
Lymphocytes Absolute: 2.4 10*3/uL (ref 0.5–4.6)
MCH: 27.2 PG (ref 26.1–32.9)
MCHC: 32.1 g/dL (ref 31.4–35.0)
MCV: 84.8 FL (ref 82–102)
MPV: 12.5 FL — ABNORMAL HIGH (ref 9.4–12.3)
Monocytes %: 8 % (ref 4.0–12.0)
Monocytes Absolute: 0.6 10*3/uL (ref 0.1–1.3)
Neutrophils %: 59 % (ref 43–78)
Neutrophils Absolute: 4.8 10*3/uL (ref 1.7–8.2)
Platelets: 289 10*3/uL (ref 150–450)
RBC: 4.34 M/uL (ref 4.05–5.2)
RDW: 15.8 % — ABNORMAL HIGH (ref 11.9–14.6)
WBC: 8.2 10*3/uL (ref 4.3–11.1)
nRBC: 0 10*3/uL (ref 0.0–0.2)

## 2023-07-26 LAB — COMPREHENSIVE METABOLIC PANEL
ALT: 12 U/L (ref 12–65)
AST: 25 U/L (ref 15–37)
Albumin/Globulin Ratio: 1.1 (ref 1.0–1.9)
Albumin: 3.5 g/dL (ref 3.5–5.0)
Alk Phosphatase: 57 U/L (ref 35–104)
Anion Gap: 8 mmol/L — ABNORMAL LOW (ref 9–18)
BUN: 8 MG/DL (ref 6–23)
CO2: 26 mmol/L (ref 20–28)
Calcium: 9.6 MG/DL (ref 8.8–10.2)
Chloride: 106 mmol/L (ref 98–107)
Creatinine: 0.69 MG/DL (ref 0.60–1.10)
Est, Glom Filt Rate: >90 mL/min/{1.73_m2} (ref >60)
Globulin: 3.1 g/dL (ref 2.3–3.5)
Glucose: 99 mg/dL (ref 70–99)
Potassium: 4 mmol/L (ref 3.5–5.1)
Sodium: 139 mmol/L (ref 136–145)
Total Bilirubin: 0.2 MG/DL (ref 0.0–1.2)
Total Protein: 6.6 g/dL (ref 6.3–8.2)

## 2023-07-26 LAB — LIPID PANEL
Chol/HDL Ratio: 3.5 (ref 0.0–5.0)
Cholesterol, Total: 171 MG/DL (ref 0–200)
HDL: 49 MG/DL (ref 40–60)
LDL Cholesterol: 110 MG/DL — ABNORMAL HIGH (ref 0–100)
Triglycerides: 60 MG/DL (ref 0–150)
VLDL Cholesterol Calculated: 12 MG/DL (ref 6–23)

## 2023-07-26 NOTE — Other (Signed)
Patient's labs are stable.

## 2023-08-02 ENCOUNTER — Encounter: Admit: 2023-08-02 | Discharge: 2023-08-02 | Attending: Psychiatry | Primary: Internal Medicine

## 2023-08-02 DIAGNOSIS — F3341 Major depressive disorder, recurrent, in partial remission: Secondary | ICD-10-CM

## 2023-08-02 MED ORDER — SERTRALINE HCL 100 MG PO TABS
100 MG | ORAL_TABLET | Freq: Every day | ORAL | 3 refills | Status: DC
Start: 2023-08-02 — End: 2023-11-05

## 2023-08-02 MED ORDER — CLONAZEPAM 0.5 MG PO TABS
0.5 MG | ORAL_TABLET | Freq: Three times a day (TID) | ORAL | 3 refills | Status: AC | PRN
Start: 2023-08-02 — End: 2023-11-30

## 2023-08-02 MED ORDER — BUPROPION HCL ER (XL) 300 MG PO TB24
300 MG | ORAL_TABLET | Freq: Every day | ORAL | 3 refills | Status: DC
Start: 2023-08-02 — End: 2023-11-05

## 2023-08-02 NOTE — Progress Notes (Signed)
Patient:  Karen Logan  Age:  42 y.o.  DOB:  11/29/1981     SEX:  female MRN:  [id number]     RACE: American [place] / [place] Native     SEEN:  [x]   PATIENT  []   SPOUSE []   OTHER:                  08/02/2023    11:48 AM 07/25/2023    11:01 AM 04/19/2023     9:14 AM   PHQ-9    Little interest or pleasure in doing things 1 0 1   Feeling down, depressed, or hopeless 0 1 0   Trouble falling or staying asleep, or sleeping too much 1 1 1    Feeling tired or having little energy 1 0 1   Poor appetite or overeating 0 0 0   Feeling bad about yourself - or that you are a failure or have let yourself or your family down 1 0 0   Trouble concentrating on things, such as reading the newspaper or watching television 1 1 1    Moving or speaking so slowly that other people could have noticed. Or the opposite - being so fidgety or restless that you have been moving around a lot more than usual 1 0 0   Thoughts that you would be better off dead, or of hurting yourself in some way 0 0 0   PHQ-2 Score 1 1 1    PHQ-9 Total Score 6 3 4    If you checked off any problems, how difficult have these problems made it for you to do your work, take care of things at home, or get along with other people? 1 1 1            08/02/2023    11:50 AM 07/02/2023     9:23 AM 04/19/2023     9:16 AM   GAD-7 SCREENING   Feeling nervous, anxious, or on edge Several days Not at all Not at all   Not being able to stop or control worrying Several days Not at all Several days   Worrying too much about different things Nearly every day Not at all Several days   Trouble relaxing Several days Not at all Not at all   Being so restless that it is hard to sit still Not at all Several days Several days   Becoming easily annoyed or irritable Not at all Several days Not at all   Feeling afraid as if something awful might happen Several days More than half the days Not at all   GAD-7 Total Score 7 4 3    How difficult have these problems made it for you to do your work,  take care of things at home, or get along with other people? Very difficult Somewhat difficult Not difficult at all            I was in the office while conducting this encounter.    Consent:  She and/or her healthcare decision maker is aware that this patient-initiated Telehealth encounter is a billable service, with coverage as determined by her insurance carrier. She is aware that she may receive a bill and has provided verbal consent to proceed: YesPatient identification was verified, and a caregiver was present when appropriate. The patient was located in a state where the provider was credentialed to provide care.    This virtual visit was conducted via MyChart. Pursuant to the emergency declaration under the [facility] Act and the  National Emergencies Act, 1135 waiver authority and the [employer] Act, this Virtual  Visit was conducted to reduce the patient's risk of exposure to COVID-19 and provide continuity of care for an established patient. Services were provided through a video synchronous discussion virtually to substitute for in-person clinic visit.  Due to this being a TeleHealth evaluation, many elements of the physical examination are unable to be assessed.       Chief complaint:  Pt says she is doing great.    Subjective:  Seen virtually for follow-up.  States doing great.  Has an interview coming up after this appointment.  She she is interviewing for a senior sitting position.  Saw her PCP on 16th and everything is good physically.  Sweating has completely stopped after she went off of the Cymbalta.  Denies any withdrawal symptoms.  Has had nausea with L-methylfolate.  Advised to take it at bedtime.  Supportive psychotherapy provided.  Patient denies suicidal ideation/homicidal ideations.  Denies symptoms of psychosis.      Patient Active Problem List   Diagnosis    Anxiety and depression    Hypertension    Class 3 severe obesity without  serious comorbidity with body mass index (BMI) of 40.0 to 44.9 in adult [facility])    Tinnitus of both ears    Intractable migraine without aura and without status migrainosus    Class 1 obesity due to excess calories with serious comorbidity and body mass index (BMI) of 34.0 to 34.9 in adult     LM 07/14/23, Birth control,   Not pregnant    Denies palpitation,SOB, Chest pain, headaches. In no acute distress.     There were no vitals taken for this visit.      MEDICATION REVIEW:    Current Medications:    Current Outpatient Medications   Medication Sig    buPROPion (WELLBUTRIN XL) 300 MG extended release tablet Take 1 tablet by mouth daily (with breakfast)    clonazePAM (KLONOPIN) 0.5 MG tablet Take 1 tablet by mouth 3 times daily as needed for Anxiety for up to 120 days. Max Daily Amount: 1.5 mg    sertraline (ZOLOFT) 100 MG tablet Take 2 tablets by mouth daily (with breakfast)    hydroCHLOROthiazide (HYDRODIURIL) 25 MG tablet Take 1 tablet by mouth daily    methylfolate (DEPLIN) 7.5 MG TABS tablet Take 7.5 tablets by mouth daily    Omega-3 Fatty Acids (OMEGA 3 PO) Take 1,000 mg by mouth daily    MAGNESIUM PO Take by mouth    Prenatal Vit-Fe Fumarate-FA (PRENATAL PO) Take by mouth    vitamin D3 (CHOLECALCIFEROL) 125 MCG (5000 UT) TABS tablet Take by mouth daily    Melatonin 1 MG CHEW Take by mouth    DULoxetine (CYMBALTA) 30 MG extended release capsule Take 30 mg once daily for one month then 30 mg once every other day for 1 month and then discontinue    predniSONE 5 MG (48) TBPK Take as directed per package instructions    Melatonin 5 MG CAPS Take by mouth     No current facility-administered medications for this visit.       Allergies   Allergen Reactions    Levonorgestrel-Ethinyl Estrad Itching     Denies allergy 02/22/22    Hydrocodone-Acetaminophen Nausea Only and Swelling       Past Medical History, Past Surgical History, Family history, Social History, and Medications were all reviewed with the patient today and  updated as necessary. Where available I have reviewed outside charts in epic and I have referenced care everywhere where possible.     Compliant with medication: Yes   Side effects from medications:  No           No data to display                   EXAMINATION  Musculoskeletal    GAIT AND STATION   []  WNL   []  RESTRICTED   []  UNSTEADY WALK        []  ABNORMAL   []  UNBALANCED  []  WHEELCHAIR/  WALKER/CANE     PSYCHIATRIC    PSYCHOMOTOR   [x]   WNL   []  RETARDATION   []  AGITATION            GENERAL APPEARANCE:   []   WELL GROOMED []      DISHEVELED   []   UNKEMPT      []   UNUSUAL/BIZZARE    [x]  WNL       ATTITUDE:   [x]  COOPERATIVE   []  GUARDED   []  SUSPICIOUS      []  HOSTILE                            BEHAVIOR:   [x]  CALM   []  HYPERACTIVE   []  MANNERISMS      []  BIZZARE         SPEECH:   [x]  NORMAL FOR CLIENT   []  SPONTANEOUS   []  SLURRED   []  WHISPERING      []  LOUD   []  PRESSURED   []  ARTICULATE        EYE CONTACT:   [x]  WNL   []  BLANK STARE   []  INTENSE      []  AVOIDANT         MOOD:   [x]  EUTHYMIC   []  ANXIOUS   []  DEPRESSED      []  IRRITABLE   []  ANGRY   []  APATHETIC     AFFECT:   [x]  CONGRUENT WITH MOOD   []  FLAT   []  CONSTRICTED      []  INAPPROPRIATE   []  LABILE           THOUGHT PROCESS:   [x]  LOGICAL/GOAL-DIRECTED   []  FOI   []  CIRCUMSANTIAL      []  INCOHERENT   []  TANGENTIAL   []  CONCRETE      []  PERSEVERATION           THOUGHT CONTENT:                DELUSIONS  [x]  DENIES  []  GRANDIOSE  []  PERSECUTORY  []  RELIGIOUS  []  REFERENCE   HALLUCINATIONS  [x]  DENIES  []  AUDITORY  []  VISUAL  []  OLFACTORY  []  TACTILE     []  GUSTATORY  []  SOMATIC         OBSESSIONS  [x]  DENIES  []  PRESENT         SUICIDAL IDEATION  [x]  DENIES  []  PRESENT W/O PLAN  []  PRESENT W/ PLAN       HOMICIDAL IDEATION  [x]  DENIES  []  PRESENT W/O PLAN  []  PRESENT W/ PLAN           JUDGEMENT:   [x]  GOOD   []  FAIR   []  POOR   INSIGHT:   [x]  GOOD   []  FAIR   []  POOR  COGNITION:           SENSORIUM:   [x]  ALERT   []  CLOUDED   []  DROWSY     ORIENTATION:    [x]  INTACT   []  TIME:  PLACE  PERSON   RECENT & REMOTE MEMORY:   [x]  NORMAL   []  OTHER:                  ATTENTION:   [x]  INTACT   []  MILD IMPAIRMENT   []  SEVERE IMPAIRMENT     CONCENTRATION:   [x]  INTACT   []  MILD IMPAIRMENT   []  SEVERE IMPAIRMENT     LANGUAGE:   [x]  AVERAGE   []  ABOVE AVERAGE   []  BELOW AVERAGE     FUND OF KNOWLEDGE:   []  UNABLE TO ASSESS AT THIS TIME   [x]  AVERAGE   []  ABOVE AVERAGE   []  BELOW AVERAGE      []  GOOD TO EXCELLENT KNOWLEDGE OF CURRENT EVENTS   []  POOR TO NO KNLEDGE OF CURRENT EVENTS                                          ABNORMAL MOVEMENTS:   [x]  NONE   []  TICS   []  TREMORS   []  BIZZARE      []  FACE   []  TRUNK   []  EXTREMETIES   []  GESTURES        SLEEP:   [x]  GOOD   []  FAIR   []  POOR      APPETITE:   [x]  GOOD   []  POOR   []  ERRATIC       MUSCLE STRENGTH AND TONE   []  WNL   []  ATROPHY   []  SPASTIC        []  FLACCID   []  COGWHEEL       Diagnoses/Impressions:    ICD-10-CM    1. Recurrent major depressive disorder, in partial remission (HCC)  F33.41       2. Generalized anxiety disorder with panic attacks  F41.1 clonazePAM (KLONOPIN) 0.5 MG tablet    F41.0       3. Primary insomnia  F51.01 clonazePAM (KLONOPIN) 0.5 MG tablet      4. Psychosocial stressors  Z65.8           TREATMENT GOALS:  Symptom reduction, Medication adherence, maintain therapeutic gains    LABS/IMAGING:    []   Ordered [x]   Reviewed []   New Labs Ordered:     LAB  WBC   Date/Time Value Ref Range Status   07/25/2023 11:28 AM 8.2 4.3 - 11.1 K/uL Final     Hemoglobin   Date/Time Value Ref Range Status   07/25/2023 11:28 AM 11.8 11.7 - 15.4 g/dL Final     Hematocrit   Date/Time Value Ref Range Status   07/25/2023 11:28 AM 36.8 35.8 - 46.3 % Final     Platelets   Date/Time Value Ref Range Status   07/25/2023 11:28 AM 289 150 - 450 K/uL Final     Sodium   Date/Time Value Ref Range Status   07/25/2023 11:28 AM 139 136 - 145 mmol/L Final     Potassium   Date/Time Value Ref Range Status   07/25/2023 11:28 AM 4.0 3.5 - 5.1  mmol/L Final     Chloride   Date/Time Value Ref Range Status   07/25/2023 11:28  AM 106 98 - 107 mmol/L Final     CO2   Date/Time Value Ref Range Status   07/25/2023 11:28 AM 26 20 - 28 mmol/L Final     BUN   Date/Time Value Ref Range Status   07/25/2023 11:28 AM 8 6 - 23 MG/DL Final     ALT   Date/Time Value Ref Range Status   07/25/2023 11:28 AM 12 12 - 65 U/L Final     AST   Date/Time Value Ref Range Status   07/25/2023 11:28 AM 25 15 - 37 U/L Final     TSH   Date/Time Value Ref Range Status   09/01/2020 03:02 PM 0.818 0.450 - 4.500 uIU/mL Final       Please refer to the lab tab in the epic and care everywhere for the most recent lab results.    Plan:     [x]   Medication ordered: Zoloft, Wellbutrin, Klonopin to target depression, anxiety, insomnia.    [x]   Medication education/counseling provided  Medication dosage and time to take, purpose/expected benefits/risks, common side effects, lab monitoring required and reason, expected length of treatment, risk of no treatment, effects on pregnancy/nursing, financial availability discussed. Educated patient on  side effects/risks/benefits of meds including  cardiac arrhythmias, suicidal ideations, orthostatic hypotension, serotonin syndrome, risk of mania/hypomania from antidepressants, withdrawals from abrupt discontinuation of meds,  risk of bleeding, risk of seizures, addiction potential, memory impairment with long term use of benzos, respiratory depression, high blood pressure, dizziness, drowsiness, sedation , risk of falls, Risk & benefits discussed: including but not limited to possible off-label medication usage.     [x]  Patient encouraged to contact the clinic if experiencing any adverse reactions with medications.      [x]  Follow MSE for sxs improvement     I have reviewed the patient's controlled substance prescription history, as maintained in the [place] prescription monitoring program, so that the prescription(s) for a  controlled substance can be  given.    Recommendations and Referrals:    Follow up with : MD, requires monitoring of response to medication, requires monitoring of medication side effects.    Time until next PMA: 3 months    Follow up with Mental Health Clinicians recommended for : psychotherapy interventions,  monitoring to prevent decompensation /hospitalization, monitoring to maintain therapeutic gains, monitoring symptoms (resolving and controlled), to improve level of functioning,         Psychotherapy note:                                __10_ Minutes of psychotherapy     [x]   Supportive psychotherapy provided to improve self-esteem, psychological functioning, and adaptive skills. Patient discussed certain situational and personal stressors ongoing in her life at this time, weight management d/w the patient. Sleep hygiene d/w patient. Patient allowed to vent out her emotions.  Scenarios were reviewed using CBT  techniques in order to increase insight and decrease anxiety.    Dysfunctional cognitions challenged and alternate options dicussed.      []   Disposition planning      []   Dangerous and will not contract for safety in the community    **P[place]int has been notified: They are to call 911 or go to their nearest E.R. if they are experiencing a medical emergency or suicidal ideations/homicidal ideations.**  All ancillary documentation entered reviewed by provider.      PLEASE NOTE:  This  document has been produced in part or whole using voice recognition software. Proofread however unrecognized errors in transcription may be present.    Daivd Council, MD    Marlynn Perking, was evaluated through a synchronous (real-time) audio-video encounter. The patient (or guardian if applicable) is aware that this is a billable service, which includes applicable co-pays. This Virtual Visit was conducted with patient's (and/or legal guardian's) consent. Patient identification was verified, and a caregiver was present when appropriate.   The patient was  located at Home: [address] [place] [address]  Provider was located at Facility (Appt Dept): [address]  [place],  [place] [address]  Confirm you are appropriately licensed, registered, or certified to deliver care in the state where the patient is located as indicated above. If you are not or unsure, please re-schedule the visit: Yes, I confirm.        Total time spent for this encounter: Not billed by time    --Daivd Council, MD on 08/02/2023 at 6:27 PM    An electronic signature was used to authenticate this note.            Patient would like to discuss possible side effects from [place].

## 2023-08-17 NOTE — Telephone Encounter (Signed)
 Pt requesting medication refill on clonazapam 0.5 mg, [facility], please call pt back, thank you.

## 2023-08-17 NOTE — Telephone Encounter (Signed)
 Returned call and left message. Patient will need to contact pharmacy for refill as this was sent 8/22 with 3 additional refills.

## 2023-09-18 ENCOUNTER — Telehealth

## 2023-09-18 NOTE — Telephone Encounter (Signed)
Patient called to have Klonopin sent to [facility] in [place]. The pharmacy in [place] does not have it in stock and not sure when it will come in. Provided patient with 3 day supply. Patient is asking for refill to be sent for the remaining amount for the month.

## 2023-09-19 MED ORDER — CLONAZEPAM 0.5 MG PO TABS
0.5 MG | ORAL_TABLET | Freq: Three times a day (TID) | ORAL | 0 refills | Status: DC | PRN
Start: 2023-09-19 — End: 2023-10-25

## 2023-09-20 NOTE — Telephone Encounter (Signed)
Left message for patient that RX was sent.

## 2023-10-02 ENCOUNTER — Encounter

## 2023-10-06 ENCOUNTER — Encounter

## 2023-10-22 NOTE — Telephone Encounter (Signed)
Patient called requesting refill klonopin 0.5 mg.      [facility]    Thank you

## 2023-10-24 NOTE — Telephone Encounter (Signed)
 Patient requesting refill to last until 11/25 appt. Was refilled for 30 days while Dr Sharol Given was out of office, however, does need an additional refill until appt.

## 2023-10-25 ENCOUNTER — Encounter

## 2023-10-25 NOTE — Telephone Encounter (Signed)
 Prescription sent. Thanks

## 2023-10-26 MED ORDER — CLONAZEPAM 0.5 MG PO TABS
0.5 | ORAL_TABLET | Freq: Three times a day (TID) | ORAL | 0 refills | Status: DC | PRN
Start: 2023-10-26 — End: 2023-11-05

## 2023-10-26 NOTE — Telephone Encounter (Signed)
 Patient notified

## 2023-11-05 ENCOUNTER — Encounter: Admit: 2023-11-05 | Admitting: Psychiatry | Primary: Internal Medicine

## 2023-11-05 DIAGNOSIS — F330 Major depressive disorder, recurrent, mild: Secondary | ICD-10-CM

## 2023-11-05 MED ORDER — CLONAZEPAM 0.5 MG PO TABS
0.5 MG | ORAL_TABLET | Freq: Three times a day (TID) | ORAL | 3 refills | Status: DC | PRN
Start: 2023-11-05 — End: 2024-02-05

## 2023-11-05 MED ORDER — BUPROPION HCL ER (XL) 300 MG PO TB24
300 MG | ORAL_TABLET | Freq: Every day | ORAL | 3 refills | Status: DC
Start: 2023-11-05 — End: 2024-02-05

## 2023-11-05 MED ORDER — SERTRALINE HCL 100 MG PO TABS
100 MG | ORAL_TABLET | Freq: Every day | ORAL | 3 refills | Status: DC
Start: 2023-11-05 — End: 2024-02-05

## 2023-11-05 NOTE — Progress Notes (Signed)
 Patient:  Karen Logan  Age:  42 y.o.  DOB:  1981-10-18     SEX:  female MRN:  [id number]     RACE: American [place] / [place] Native     SEEN:  [x]   PATIENT  []   SPOUSE []   OTHER:                  11/05/2023     1:42 PM 08/02/2023    11:48 AM

## 2023-11-29 ENCOUNTER — Encounter: Admit: 2023-11-29 | Admitting: Internal Medicine

## 2023-11-29 DIAGNOSIS — I10 Essential (primary) hypertension: Secondary | ICD-10-CM

## 2023-11-30 MED ORDER — HYDROCHLOROTHIAZIDE 25 MG PO TABS
25 | ORAL_TABLET | Freq: Every day | ORAL | 2 refills | Status: DC
Start: 2023-11-30 — End: 2024-10-14

## 2023-11-30 NOTE — Telephone Encounter (Signed)
 LOV 07/25/2023  NOV 07/30/2024

## 2024-02-05 ENCOUNTER — Telehealth: Admit: 2024-02-05 | Discharge: 2024-02-05 | Attending: Psychiatry | Primary: Internal Medicine

## 2024-02-05 DIAGNOSIS — F330 Major depressive disorder, recurrent, mild: Secondary | ICD-10-CM

## 2024-02-05 MED ORDER — ARIPIPRAZOLE 2 MG PO TABS
2 MG | ORAL_TABLET | Freq: Every day | ORAL | 3 refills | Status: DC
Start: 2024-02-05 — End: 2024-04-25

## 2024-02-05 MED ORDER — BUPROPION HCL ER (XL) 300 MG PO TB24
300 MG | ORAL_TABLET | Freq: Every day | ORAL | 3 refills | Status: DC
Start: 2024-02-05 — End: 2024-04-25

## 2024-02-05 MED ORDER — SERTRALINE HCL 100 MG PO TABS
100 | ORAL_TABLET | Freq: Every day | ORAL | 3 refills | Status: AC
Start: 2024-02-05 — End: ?

## 2024-02-05 MED ORDER — CLONAZEPAM 0.5 MG PO TABS
0.5 MG | ORAL_TABLET | Freq: Three times a day (TID) | ORAL | 3 refills | Status: DC | PRN
Start: 2024-02-05 — End: 2024-04-25

## 2024-02-05 NOTE — Progress Notes (Signed)
 Patient:  Karen Logan  Age:  43 y.o.  DOB:  03-Aug-1981     SEX:  female MRN:  [id number]     RACE: American [place] / [place] Native     SEEN:  [x]   PATIENT  []   SPOUSE []   OTHER:                  02/05/2024     2:10 PM 11/05/2023     1:42 PM 08/02/2023    11:48 AM   PHQ-9    Little interest or pleasure in doing things 1 3 1    Feeling down, depressed, or hopeless 1 0 0   Trouble falling or staying asleep, or sleeping too much 1 0 1   Feeling tired or having little energy 1 2 1    Poor appetite or overeating 2 3 0   Feeling bad about yourself - or that you are a failure or have let yourself or your family down 1 1 1    Trouble concentrating on things, such as reading the newspaper or watching television 1 0 1   Moving or speaking so slowly that other people could have noticed. Or the opposite - being so fidgety or restless that you have been moving around a lot more than usual 2 1 1    Thoughts that you would be better off dead, or of hurting yourself in some way 0 0 0   PHQ-2 Score 2 3 1    PHQ-9 Total Score 10 10 6    If you checked off any problems, how difficult have these problems made it for you to do your work, take care of things at home, or get along with other people? 1 1 1            02/05/2024     2:08 PM 11/05/2023     1:48 PM 08/02/2023    11:50 AM   GAD-7 SCREENING   Feeling nervous, anxious, or on edge Several days Several days Several days   Not being able to stop or control worrying Several days Not at all Several days   Worrying too much about different things Several days Not at all Nearly every day   Trouble relaxing Several days Not at all Several days   Being so restless that it is hard to sit still Several days Not at all Not at all   Becoming easily annoyed or irritable Several days Not at all Not at all   Feeling afraid as if something awful might happen Several days Not at all Several days   GAD-7 Total Score 7 1 7    How difficult have these problems made it for you to do your work,  take care of things at home, or get along with other people? Somewhat difficult Very difficult Very difficult            I was in the office while conducting this encounter.    Consent:  She and/or her healthcare decision maker is aware that this patient-initiated Telehealth encounter is a billable service, with coverage as determined by her insurance carrier. She is aware that she may receive a bill and has provided verbal consent to proceed: YesPatient identification was verified, and a caregiver was present when appropriate. The patient was located in a state where the provider was credentialed to provide care.    This virtual visit was conducted via MyChart. Pursuant to the emergency declaration under the [employer] and the [employer], [employer] waiver authority  and the Coronavirus Preparedness and Response Supplemental Appropriations Act, this Virtual  Visit was conducted to reduce the patient's risk of exposure to COVID-19 and provide continuity of care for an established patient. Services were provided through a video synchronous discussion virtually to substitute for in-person clinic visit.  Due to this being a TeleHealth evaluation, many elements of the physical examination are unable to be assessed.       Chief complaint:  Pt says she is doing fine.  Follow-up for major depression and generalized anxiety.    Subjective:  Seen virtually for follow-up.  States has been doing fine.  Doing a part-time job now [employer].  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  Husband works all the time.  She has applied at various jobs and had some interviews.  She also has some interviews lined up.  She loves the hospitality field.  Misses it now.  You meet people from all over the world.  She has ears of management and people skills.  Has been thinking about other business ideas.  Has been having her hardest time with the fact that why people treat her so bad  especially women.  Is it the way she talks or the way she looks.  Dysfunctional cognitions challenged and alternate thoughts encouraged.  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  Supportive psychotherapy provided.  Patient denies suicidal ideation/homicidal ideations.  Denies symptoms of psychosis.      Patient Active Problem List   Diagnosis    Anxiety and depression    Hypertension    Class 3 severe obesity without serious comorbidity with body mass index (BMI) of 40.0 to 44.9 in adult    Tinnitus of both ears    Intractable migraine without aura and without status migrainosus    Class 1 obesity due to excess calories with serious comorbidity and body mass index (BMI) of 34.0 to 34.9 in adult     LMP 01/26/2024, Birth control,   Not pregnant    Denies palpitation,SOB, Chest pain, headaches. In no acute distress.     There were no vitals taken for this visit.      MEDICATION REVIEW:    Current Medications:    Current Outpatient Medications   Medication Sig    buPROPion (WELLBUTRIN XL) 300 MG extended release tablet Take 1 tablet by mouth daily (with breakfast)    clonazePAM (KLONOPIN) 0.5 MG tablet Take 1 tablet by mouth 3 times daily as needed for Anxiety for up to 120 days. Max Daily Amount: 1.5 mg    sertraline (ZOLOFT) 100 MG tablet Take 2 tablets by mouth daily (with breakfast)    ARIPiprazole (ABILIFY) 2 MG tablet Take 1 tablet by mouth daily    hydroCHLOROthiazide (HYDRODIURIL) 25 MG tablet Take 1 tablet by mouth once daily    vitamin C (ASCORBIC ACID) 500 MG tablet Take 2 tablets by mouth daily    methylfolate (DEPLIN) 7.5 MG TABS tablet Take 7.5 tablets by mouth daily    Omega-3 Fatty Acids (OMEGA 3 PO) Take 1,000 mg by mouth daily    MAGNESIUM PO Take by mouth    Prenatal Vit-Fe Fumarate-FA (PRENATAL PO) Take by mouth Includes DHA    vitamin D3 (CHOLECALCIFEROL) 125 MCG (5000 UT) TABS tablet Take by mouth daily    calcium carbonate (TUMS) 500 MG  chewable tablet Take 1 tablet by mouth daily    Melatonin  1 MG CHEW Take by mouth    predniSONE 5 MG (48) TBPK Take as directed per package instructions    Melatonin 5 MG CAPS Take by mouth     No current facility-administered medications for this visit.       Allergies   Allergen Reactions    Levonorgestrel-Ethinyl Estrad Itching     Denies allergy 02/22/22    Hydrocodone-Acetaminophen Nausea Only and Swelling       Past Medical History, Past Surgical History, Family history, Social History, and Medications were all reviewed with the patient today and updated as necessary. Where available I have reviewed outside charts in epic and I have referenced care everywhere where possible.     Compliant with medication: Yes   Side effects from medications:  No           No data to display                   EXAMINATION  Musculoskeletal    GAIT AND STATION   []  WNL   []  RESTRICTED   []  UNSTEADY WALK        []  ABNORMAL   []  UNBALANCED  []  WHEELCHAIR/  WALKER/CANE     PSYCHIATRIC    PSYCHOMOTOR   [x]   WNL   []  RETARDATION   []  AGITATION            GENERAL APPEARANCE:   [x]   WELL GROOMED []      DISHEVELED   []   UNKEMPT      []   UNUSUAL/BIZZARE    []  WNL       ATTITUDE:   [x]  COOPERATIVE   []  GUARDED   []  SUSPICIOUS      []  HOSTILE                            BEHAVIOR:   [x]  CALM   []  HYPERACTIVE   []  MANNERISMS      []  BIZZARE         SPEECH:   [x]  NORMAL FOR CLIENT   []  SPONTANEOUS   []  SLURRED   []  WHISPERING      []  LOUD   []  PRESSURED   []  ARTICULATE        EYE CONTACT:   [x]  WNL   []  BLANK STARE   []  INTENSE      []  AVOIDANT         MOOD:   []  EUTHYMIC   [x]  ANXIOUS   [x]  DEPRESSED      []  IRRITABLE   []  ANGRY   []  APATHETIC     AFFECT:   [x]  CONGRUENT WITH MOOD   []  FLAT   []  CONSTRICTED      []  INAPPROPRIATE   []  LABILE           THOUGHT PROCESS:   [x]  LOGICAL/GOAL-DIRECTED   []  FOI   []  CIRCUMSANTIAL      []  INCOHERENT   []  TANGENTIAL   []  CONCRETE      []  PERSEVERATION           THOUGHT CONTENT:                DELUSIONS   [x]  DENIES  []  GRANDIOSE  []  PERSECUTORY  []  RELIGIOUS  []  REFERENCE   HALLUCINATIONS  [x]  DENIES  []  AUDITORY  []  VISUAL  []  OLFACTORY  []  TACTILE     []  GUSTATORY  []  SOMATIC  OBSESSIONS  [x]  DENIES  []  PRESENT         SUICIDAL IDEATION  [x]  DENIES  []  PRESENT W/O PLAN  []  PRESENT W/ PLAN       HOMICIDAL IDEATION  [x]  DENIES  []  PRESENT W/O PLAN  []  PRESENT W/ PLAN           JUDGEMENT:   [x]  GOOD   []  FAIR   []  POOR   INSIGHT:   [x]  GOOD   []  FAIR   []  POOR     COGNITION:           SENSORIUM:   [x]  ALERT   []  CLOUDED   []  DROWSY     ORIENTATION:   [x]  INTACT   []  TIME:  PLACE  PERSON   RECENT & REMOTE MEMORY:   [x]  NORMAL   []  OTHER:                  ATTENTION:   []  INTACT   [x]  MILD IMPAIRMENT   []  SEVERE IMPAIRMENT     CONCENTRATION:   []  INTACT   [x]  MILD IMPAIRMENT   []  SEVERE IMPAIRMENT     LANGUAGE:   [x]  AVERAGE   []  ABOVE AVERAGE   []  BELOW AVERAGE     FUND OF KNOWLEDGE:   []  UNABLE TO ASSESS AT THIS TIME   [x]  AVERAGE   []  ABOVE AVERAGE   []  BELOW AVERAGE      []  GOOD TO EXCELLENT KNOWLEDGE OF CURRENT EVENTS   []  POOR TO NO KNLEDGE OF CURRENT EVENTS                                          ABNORMAL MOVEMENTS:   [x]  NONE   []  TICS   []  TREMORS   []  BIZZARE      []  FACE   []  TRUNK   []  EXTREMETIES   []  GESTURES        SLEEP:   []  GOOD   [x]  FAIR   []  POOR      APPETITE:   []  GOOD   [x]  POOR   []  ERRATIC       MUSCLE STRENGTH AND TONE   []  WNL   []  ATROPHY   []  SPASTIC        []  FLACCID   []  COGWHEEL       Diagnoses/Impressions:    ICD-10-CM    1. Mild episode of recurrent major depressive disorder  F33.0       2. Generalized anxiety disorder with panic attacks  F41.1 clonazePAM (KLONOPIN) 0.5 MG tablet    F41.0       3. Primary insomnia  F51.01 clonazePAM (KLONOPIN) 0.5 MG tablet      4. Psychosocial stressors  Z65.8           TREATMENT GOALS:  Symptom reduction, Medication adherence, maintain therapeutic gains    LABS/IMAGING:    []   Ordered [x]   Reviewed []   New Labs Ordered:     LAB  WBC    Date/Time Value Ref Range Status   07/25/2023 11:28 AM 8.2 4.3 - 11.1 K/uL Final     Hemoglobin   Date/Time Value Ref Range Status   07/25/2023 11:28 AM 11.8 11.7 - 15.4 g/dL Final     Hematocrit   Date/Time Value Ref Range Status   07/25/2023 11:28 AM  36.8 35.8 - 46.3 % Final     Platelets   Date/Time Value Ref Range Status   07/25/2023 11:28 AM 289 150 - 450 K/uL Final     Sodium   Date/Time Value Ref Range Status   07/25/2023 11:28 AM 139 136 - 145 mmol/L Final     Potassium   Date/Time Value Ref Range Status   07/25/2023 11:28 AM 4.0 3.5 - 5.1 mmol/L Final     Chloride   Date/Time Value Ref Range Status   07/25/2023 11:28 AM 106 98 - 107 mmol/L Final     CO2   Date/Time Value Ref Range Status   07/25/2023 11:28 AM 26 20 - 28 mmol/L Final     BUN   Date/Time Value Ref Range Status   07/25/2023 11:28 AM 8 6 - 23 MG/DL Final     ALT   Date/Time Value Ref Range Status   07/25/2023 11:28 AM 12 12 - 65 U/L Final     AST   Date/Time Value Ref Range Status   07/25/2023 11:28 AM 25 15 - 37 U/L Final     TSH   Date/Time Value Ref Range Status   09/01/2020 03:02 PM 0.818 0.450 - 4.500 uIU/mL Final       Please refer to the lab tab in the epic and care everywhere for the most recent lab results.    Plan:        [x]   Medication ordered: Zoloft, Wellbutrin, Abilify, Klonopin to target depression, anxiety, insomnia    [x]   Medication education/counseling provided   Medication dosage and time to take, purpose/expected benefits/risks, common side effects, lab monitoring required and reason, expected length of treatment, risk of no treatment, effects on pregnancy/nursing, financial availability discussed. Educated patient on  side effects/risks/benefits of meds including metabolic syndrome risk, EPS, increased risk of cerebrovascular accidents and mortality in elderly, akathisia,  cardiac arrhythmias, suicidal ideations, orthostatic hypotension, serotonin syndrome, risk of mania/hypomania from antidepressants, withdrawals from  abrupt discontinuation of meds, risk of bleeding, risk of seizures, addiction potential, memory impairment with long term use of benzos, respiratory depression, high blood pressure, dizziness, drowsiness, sedation , risk of falls, Risk & benefits discussed: including but not limited to possible off-label medication usage.     [x]  Patient encouraged to contact the clinic if experiencing any adverse reactions with medications.     [x]  Follow MSE for sxs improvement     I have reviewed the patient's controlled substance prescription history, as maintained in the [place] prescription monitoring program, so that the prescription(s) for a  controlled substance can be given.    Recommendations and Referrals:    Follow up with : MD, requires monitoring of response to medication, requires monitoring of medication side effects.    Time until next PMA: 3 months    Follow up with Mental Health Clinicians recommended for : psychotherapy interventions,  monitoring to prevent decompensation /hospitalization, monitoring to maintain therapeutic gains, monitoring symptoms (resolving and controlled), to improve level of functioning,           Psychotherapy note:                                __16_ Minutes of psychotherapy     [x]   Supportive psychotherapy provided to improve self-esteem, psychological functioning, and adaptive skills.  Patient discussed certain situational and personal stressors ongoing in her life at this time, weight management d/w  the patient. Sleep hygiene d/w patient. Patient allowed to vent out her emotions. Active listening were provided,   Scenarios were reviewed using CBT  techniques in order to increase insight and decrease anxiety.    Dysfunctional cognitions challenged  and alternate options discussed. Strengths were validated and reinforced.   Discussed working on coping skills and relaxation techniques (meditation, mindfulness, breathing exercises) to help mitigate anxiety and improve sleep.  Suggested reading, listening to music, outdoor activities,  exercise as tolerated, puzzles, art (drawing, coloring, painting) to alleviate anxiety.      []   Disposition planning      []   Dangerous and will not contract for safety in the community    **P[place]int has been notified: They are to call 911 or go to their nearest E.R. if they are experiencing a medical emergency or suicidal ideations/homicidal ideations.**  All ancillary documentation entered reviewed by provider.      PLEASE NOTE:  This document has been produced in part or whole using voice recognition software. Proofread however unrecognized errors in transcription may be present.    Daivd Council, MD    Marlynn Perking, was evaluated through a synchronous (real-time) audio-video encounter. The patient (or guardian if applicable) is aware that this is a billable service, which includes applicable co-pays. This Virtual Visit was conducted with patient's (and/or legal guardian's) consent. Patient identification was verified, and a caregiver was present when appropriate.   The patient was located at Home: [address]  [place] [address]  Provider was located at Facility (Appt Dept): [address]  [place],  [place] [address]  Confirm you are appropriately licensed, registered, or certified to deliver care in the state where the patient is located as indicated above. If you are not or unsure, please re-schedule the visit: Yes, I confirm.        Total time spent for this encounter: Not billed by time    --Daivd Council, MD on 02/05/2024 at 6:14 PM    An electronic signature was used to authenticate this note.

## 2024-03-04 ENCOUNTER — Encounter

## 2024-04-22 ENCOUNTER — Encounter: Attending: Psychiatry | Primary: Internal Medicine

## 2024-04-25 ENCOUNTER — Telehealth: Admit: 2024-04-25 | Discharge: 2024-04-25 | Attending: Psychiatry | Primary: Internal Medicine

## 2024-04-25 DIAGNOSIS — F330 Major depressive disorder, recurrent, mild: Secondary | ICD-10-CM

## 2024-04-25 MED ORDER — BUPROPION HCL ER (XL) 300 MG PO TB24
300 | ORAL_TABLET | Freq: Every day | ORAL | 3 refills | 90.00000 days | Status: AC
Start: 2024-04-25 — End: ?

## 2024-04-25 MED ORDER — CLONAZEPAM 0.5 MG PO TABS
0.5 | ORAL_TABLET | Freq: Three times a day (TID) | ORAL | 3 refills | 30.00000 days | Status: AC | PRN
Start: 2024-04-25 — End: 2024-10-02

## 2024-04-25 MED ORDER — SERTRALINE HCL 25 MG PO TABS
25 | ORAL_TABLET | Freq: Every day | ORAL | 0 refills | 90.00000 days | Status: AC
Start: 2024-04-25 — End: ?

## 2024-04-25 MED ORDER — DESVENLAFAXINE SUCCINATE ER 50 MG PO TB24
50 | ORAL_TABLET | ORAL | 3 refills | 30.00000 days | Status: AC
Start: 2024-04-25 — End: ?

## 2024-04-25 NOTE — Progress Notes (Signed)
 Patient:  Karen Logan  Age:  43 y.o.  DOB:  10/20/1981     SEX:  female MRN:  [id number]     RACE: American [place] / [place] Native     SEEN:  [x]   PATIENT  []   SPOUSE []   OTHER:                  04/25/2024    10:31 AM 02/05/2024     2:10 PM 11/05/2023     1:42 PM   PHQ-9    Little interest or pleasure in doing things 1 1 3    Feeling down, depressed, or hopeless 1 1 0   Trouble falling or staying asleep, or sleeping too much 1 1 0   Feeling tired or having little energy 1 1 2    Poor appetite or overeating 0 2 3   Feeling bad about yourself - or that you are a failure or have let yourself or your family down 1 1 1    Trouble concentrating on things, such as reading the newspaper or watching television 1 1 0   Moving or speaking so slowly that other people could have noticed. Or the opposite - being so fidgety or restless that you have been moving around a lot more than usual 2 2 1    Thoughts that you would be better off dead, or of hurting yourself in some way 0 0 0   PHQ-2 Score 2  2  3    PHQ-9 Total Score 8  10  10    If you checked off any problems, how difficult have these problems made it for you to do your work, take care of things at home, or get along with other people? 1 1 1        Patient-reported           04/25/2024    10:30 AM 02/05/2024     2:08 PM 11/05/2023     1:48 PM   GAD-7 SCREENING   Feeling nervous, anxious, or on edge Several days Several days Several days   Not being able to stop or control worrying Several days Several days Not at all   Worrying too much about different things More than half the days Several days Not at all   Trouble relaxing Several days Several days Not at all   Being so restless that it is hard to sit still Several days Several days Not at all   Becoming easily annoyed or irritable Several days Several days Not at all   Feeling afraid as if something awful might happen Several days Several days Not at all   GAD-7 Total Score 8  7  1    How difficult have these  problems made it for you to do your work, take care of things at home, or get along with other people? Somewhat difficult Somewhat difficult Very difficult       Patient-reported            I was in the office while conducting this encounter.    Consent:  She and/or her healthcare decision maker is aware that this patient-initiated Telehealth encounter is a billable service, with coverage as determined by her insurance carrier. She is aware that she may receive a bill and has provided verbal consent to proceed: YesPatient identification was verified, and a caregiver was present when appropriate. The patient was located in a state where the provider was credentialed to provide care.    This virtual visit was  conducted via MyChart. Pursuant to the emergency declaration under the [facility] Act and the [employer], 1135 waiver authority and the [employer] Act, this Virtual  Visit was conducted to reduce the patient's risk of exposure to COVID-19 and provide continuity of care for an established patient. Services were provided through a video synchronous discussion virtually to substitute for in-person clinic visit.  Due to this being a TeleHealth evaluation, many elements of the physical examination are unable to be assessed.       Chief complaint:  Pt says Abilify  made her agitated.  So she stopped taking it after taking 2 tablets.  Follow-up for major depression and generalized anxiety.    Subjective:  Seen virtually for follow-up.  States doing okay.  States feels like her system is getting used to the Zoloft .  She has taken it for years.  Wants to change it to another antidepressant.  Her GeneSight testing results reviewed with her..  Switch her from Zoloft  to Pristiq.  Crossover explained.  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  And doing good with it and has been looking for work.  Husband has a stable job.   Abilify  caused agitation  and made her very mean.  She took 1 tablet 1 day and half tablet the other day and then did not take it.  She has been getting closer to her religion and that is helping her.  She has a hard time admitting that she needs something to keep her calmer.  Supportive psychotherapy provided.  Patient denies suicidal ideation/homicidal ideations.  Denies symptoms of psychosis.      Patient Active Problem List   Diagnosis    Anxiety and depression    Hypertension    Class 3 severe obesity without serious comorbidity with body mass index (BMI) of 40.0 to 44.9 in adult [facility])    Tinnitus of both ears    Intractable migraine without aura and without status migrainosus    Class 1 obesity due to excess calories with serious comorbidity and body mass index (BMI) of 34.0 to 34.9 in adult     LMP 04/11/2024, Birth control,   Not pregnant    Denies palpitation,SOB, Chest pain, headaches. In no acute distress.     There were no vitals taken for this visit.      MEDICATION REVIEW:    Current Medications:    Current Outpatient Medications   Medication Sig    buPROPion  (WELLBUTRIN  XL) 300 MG extended release tablet Take 1 tablet by mouth daily (with breakfast)    clonazePAM  (KLONOPIN ) 0.5 MG tablet Take 1 tablet by mouth 3 times daily as needed for Anxiety for up to 120 days. Max Daily Amount: 1.5 mg    sertraline  (ZOLOFT ) 100 MG tablet Take 2 tablets by mouth daily (with breakfast)    hydroCHLOROthiazide  (HYDRODIURIL ) 25 MG tablet Take 1 tablet by mouth once daily    vitamin C (ASCORBIC ACID) 500 MG tablet Take 2 tablets by mouth daily    methylfolate (DEPLIN) 7.5 MG TABS tablet Take 7.5 tablets by mouth daily    Omega-3 Fatty Acids (OMEGA 3 PO) Take 1,000 mg by mouth daily    MAGNESIUM PO Take by mouth    Prenatal Vit-Fe Fumarate-FA (PRENATAL PO) Take by mouth Includes DHA    vitamin D3 (CHOLECALCIFEROL) 125 MCG (5000 UT) TABS tablet Take by mouth daily    calcium carbonate (TUMS) 500 MG chewable tablet Take 1 tablet  by mouth daily     Melatonin 1 MG CHEW Take by mouth    predniSONE  5 MG (48) TBPK Take as directed per package instructions    Melatonin 5 MG CAPS Take by mouth     No current facility-administered medications for this visit.       Allergies   Allergen Reactions    Levonorgestrel-Ethinyl Estrad Itching     Denies allergy 02/22/22    Hydrocodone-Acetaminophen Nausea Only and Swelling       Past Medical History, Past Surgical History, Family history, Social History, and Medications were all reviewed with the patient today and updated as necessary. Where available I have reviewed outside charts in epic and I have referenced care everywhere where possible.     Compliant with medication: Yes   Side effects from medications:  No           No data to display                   EXAMINATION  Musculoskeletal    GAIT AND STATION   []  WNL   []  RESTRICTED   []  UNSTEADY WALK        []  ABNORMAL   []  UNBALANCED  []  WHEELCHAIR/  WALKER/CANE     PSYCHIATRIC    PSYCHOMOTOR   [x]   WNL   []  RETARDATION   []  AGITATION            GENERAL APPEARANCE:   [x]   WELL GROOMED []      DISHEVELED   []   UNKEMPT      []   UNUSUAL/BIZZARE    []  WNL       ATTITUDE:   [x]  COOPERATIVE   []  GUARDED   []  SUSPICIOUS      []  HOSTILE                            BEHAVIOR:   [x]  CALM   []  HYPERACTIVE   []  MANNERISMS      []  BIZZARE         SPEECH:   [x]  NORMAL FOR CLIENT   []  SPONTANEOUS   []  SLURRED   []  WHISPERING      []  LOUD   []  PRESSURED   []  ARTICULATE        EYE CONTACT:   [x]  WNL   []  BLANK STARE   []  INTENSE      []  AVOIDANT         MOOD:   []  EUTHYMIC   [x]  ANXIOUS   [x]  DEPRESSED      []  IRRITABLE   []  ANGRY   []  APATHETIC     AFFECT:   [x]  CONGRUENT WITH MOOD   []  FLAT   []  CONSTRICTED      []  INAPPROPRIATE   []  LABILE           THOUGHT PROCESS:   [x]  LOGICAL/GOAL-DIRECTED   []  FOI   []  CIRCUMSANTIAL      []  INCOHERENT   []  TANGENTIAL   []  CONCRETE      []  PERSEVERATION           THOUGHT CONTENT:                DELUSIONS  [x]  DENIES  []  GRANDIOSE  []  PERSECUTORY  []   RELIGIOUS  []  REFERENCE   HALLUCINATIONS  [x]  DENIES  []  AUDITORY  []  VISUAL  []  OLFACTORY  []  TACTILE     []   GUSTATORY  []  SOMATIC         OBSESSIONS  [x]  DENIES  []  PRESENT         SUICIDAL IDEATION  [x]  DENIES  []  PRESENT W/O PLAN  []  PRESENT W/ PLAN       HOMICIDAL IDEATION  [x]  DENIES  []  PRESENT W/O PLAN  []  PRESENT W/ PLAN           JUDGEMENT:   [x]  GOOD   []  FAIR   []  POOR   INSIGHT:   [x]  GOOD   []  FAIR   []  POOR     COGNITION:           SENSORIUM:   [x]  ALERT   []  CLOUDED   []  DROWSY     ORIENTATION:   [x]  INTACT   []  TIME:  PLACE  PERSON   RECENT & REMOTE MEMORY:   [x]  NORMAL   []  OTHER:                  ATTENTION:   []  INTACT   [x]  MILD IMPAIRMENT   []  SEVERE IMPAIRMENT     CONCENTRATION:   []  INTACT   [x]  MILD IMPAIRMENT   []  SEVERE IMPAIRMENT     LANGUAGE:   [x]  AVERAGE   []  ABOVE AVERAGE   []  BELOW AVERAGE     FUND OF KNOWLEDGE:   []  UNABLE TO ASSESS AT THIS TIME   [x]  AVERAGE   []  ABOVE AVERAGE   []  BELOW AVERAGE      []  GOOD TO EXCELLENT KNOWLEDGE OF CURRENT EVENTS   []  POOR TO NO KNLEDGE OF CURRENT EVENTS                                          ABNORMAL MOVEMENTS:   [x]  NONE   []  TICS   []  TREMORS   []  BIZZARE      []  FACE   []  TRUNK   []  EXTREMETIES   []  GESTURES        SLEEP:   []  GOOD   [x]  FAIR   []  POOR      APPETITE:   [x]  GOOD   []  POOR   []  ERRATIC       MUSCLE STRENGTH AND TONE   []  WNL   []  ATROPHY   []  SPASTIC        []  FLACCID   []  COGWHEEL       Diagnoses/Impressions:    ICD-10-CM    1. Mild episode of recurrent major depressive disorder  F33.0       2. Generalized anxiety disorder with panic attacks  F41.1     F41.0       3. Primary insomnia  F51.01       4. Psychosocial stressors  Z65.8           TREATMENT GOALS:  Symptom reduction, Medication adherence, maintain therapeutic gains    LABS/IMAGING:    []   Ordered [x]   Reviewed []   New Labs Ordered:     LAB  WBC   Date/Time Value Ref Range Status   07/25/2023 11:28 AM 8.2 4.3 - 11.1 K/uL Final     Hemoglobin   Date/Time Value Ref  Range Status   07/25/2023 11:28 AM 11.8 11.7 - 15.4 g/dL Final     Hematocrit   Date/Time Value Ref Range Status  07/25/2023 11:28 AM 36.8 35.8 - 46.3 % Final     Platelets   Date/Time Value Ref Range Status   07/25/2023 11:28 AM 289 150 - 450 K/uL Final     Sodium   Date/Time Value Ref Range Status   07/25/2023 11:28 AM 139 136 - 145 mmol/L Final     Potassium   Date/Time Value Ref Range Status   07/25/2023 11:28 AM 4.0 3.5 - 5.1 mmol/L Final     Chloride   Date/Time Value Ref Range Status   07/25/2023 11:28 AM 106 98 - 107 mmol/L Final     CO2   Date/Time Value Ref Range Status   07/25/2023 11:28 AM 26 20 - 28 mmol/L Final     BUN   Date/Time Value Ref Range Status   07/25/2023 11:28 AM 8 6 - 23 MG/DL Final     ALT   Date/Time Value Ref Range Status   07/25/2023 11:28 AM 12 12 - 65 U/L Final     AST   Date/Time Value Ref Range Status   07/25/2023 11:28 AM 25 15 - 37 U/L Final     TSH   Date/Time Value Ref Range Status   09/01/2020 03:02 PM 0.818 0.450 - 4.500 uIU/mL Final       Please refer to the lab tab in the epic and care everywhere for the most recent lab results.    Plan:        [x]   Medication ordered: Wellbutrin , Pristiq, Klonopin  to target depression, anxiety, insomnia.  Crossover from Zoloft  to Pristiq explained.    [x]   Medication education/counseling provided   Medication dosage and time to take, purpose/expected benefits/risks, common side effects, lab monitoring required and reason, expected length of treatment, risk of no treatment, effects on pregnancy/nursing, financial availability discussed. Educated patient on  side effects/risks/benefits of meds including cardiac arrhythmias, suicidal ideations, orthostatic hypotension, serotonin syndrome, risk of mania/hypomania from antidepressants, withdrawals from abrupt discontinuation of meds,  risk of bleeding, risk of seizures, addiction potential, memory impairment with long term use of benzos, respiratory depression, high blood pressure, dizziness,  drowsiness, sedation , risk of falls, Risk & benefits discussed: including but not limited to possible off-label medication usage.     [x]  Patient encouraged to contact the clinic if experiencing any adverse reactions with medications.     [x]  Follow MSE for sxs improvement     I have reviewed the patient's controlled substance prescription history, as maintained in the [place]  prescription monitoring program, so that the prescription(s) for a  controlled substance can be given.    Recommendations and Referrals:    Follow up with : MD, requires monitoring of response to medication, requires monitoring of medication side effects.    Time until next PMA: 3 months    Follow up with Mental Health Clinicians recommended for : psychotherapy interventions,  monitoring to prevent decompensation /hospitalization, monitoring to maintain therapeutic gains, monitoring symptoms (resolving and controlled), to improve level of functioning,           Psychotherapy note:                                __10_ Minutes of psychotherapy     [x]   Supportive psychotherapy provided to improve self-esteem, psychological functioning, and adaptive skills.  Patient discussed certain situational and personal stressors ongoing in her life at this time, weight management d/w the patient. Sleep hygiene d/w  patient. Patient allowed to vent out her emotions. Active listening were provided,   Scenarios were reviewed using CBT  techniques in order to increase insight and decrease anxiety.    Dysfunctional cognitions challenged and alternate options discussed. Strengths were validated and reinforced.   Discussed working on coping skills and relaxation techniques (meditation, mindfulness, breathing exercises) to help mitigate anxiety and improve sleep. Suggested reading, listening to music, outdoor activities,  exercise as tolerated, puzzles, art (drawing, coloring, painting) to alleviate anxiety.      []   Disposition planning      []   Dangerous  and will not contract for safety in the community    **Pateint has been notified: They are to call 911 or go to their nearest E.R. if they are experiencing a medical emergency or suicidal ideations/homicidal ideations.**  All ancillary documentation entered reviewed by provider.      PLEASE NOTE:  This document has been produced in part or whole using voice recognition software. Proofread however unrecognized errors in transcription may be present.    Barb Bonito, MD    Karen Logan, was evaluated through a synchronous (real-time) audio-video encounter. The patient (or guardian if applicable) is aware that this is a billable service, which includes applicable co-pays. This Virtual Visit was conducted with patient's (and/or legal guardian's) consent. Patient identification was verified, and a caregiver was present when appropriate.   The patient was located at Home: [address]  [place] [address]  Provider was located at Facility (Appt Dept): [address]  [place],  [place] [address]  Confirm you are appropriately licensed, registered, or certified to deliver care in the state where the patient is located as indicated above. If you are not or unsure, please re-schedule the visit: Yes, I confirm.        Total time spent for this encounter: Not billed by time    --Barb Bonito, MD on 04/25/2024 at 12:18 PM    An electronic signature was used to authenticate this note.            The medication(s) will be due around 06/04/24.  They have been pended with the appropriate dates added. Only 1 refill was placed on the Clonazepam  to get her to the 07/29/24 in-person visit.    The pt also notes she is not taking the Abilify .  She states she picked the medication up and took the 1 whole tablet, but it made her extremely aggitated and "mean".  So, the next day she tried taking a 1/2 tablet, but it had the exact same effect.  So, she D/C'd it.

## 2024-04-30 ENCOUNTER — Encounter

## 2024-05-13 ENCOUNTER — Encounter

## 2024-05-13 NOTE — Telephone Encounter (Signed)
 Refill has been requested for HCTZ 25 mg, taking 1 po qd.  Last prescribed 11-30-23 #90 2 RF  Refill shouldn't be needed until Sept.  Patient will call pharmacy to check if prescription is on hold.

## 2024-07-29 ENCOUNTER — Ambulatory Visit: Admit: 2024-07-29 | Discharge: 2024-07-29 | Attending: Psychiatry | Primary: Internal Medicine

## 2024-07-29 MED ORDER — CLONAZEPAM 0.5 MG PO TABS
0.5 | ORAL_TABLET | Freq: Three times a day (TID) | ORAL | 3 refills | 30.00000 days | Status: DC | PRN
Start: 2024-07-29 — End: 2024-10-29

## 2024-07-29 MED ORDER — DESVENLAFAXINE SUCCINATE ER 50 MG PO TB24
50 | ORAL_TABLET | Freq: Every day | ORAL | 3 refills | 90.00000 days | Status: DC
Start: 2024-07-29 — End: 2024-10-29

## 2024-07-29 MED ORDER — BUPROPION HCL ER (XL) 300 MG PO TB24
300 | ORAL_TABLET | Freq: Every day | ORAL | 3 refills | 90.00000 days | Status: DC
Start: 2024-07-29 — End: 2024-10-29

## 2024-07-29 NOTE — Progress Notes (Signed)
 Patient:  Karen Logan  Age:  43 y.o.  DOB:  1981/04/10     SEX:  female MRN:  [id number]     RACE: American [place] / [place] Native     SEEN:  [x]   PATIENT  []   SPOUSE []   OTHER:                  07/29/2024    11:39 AM 04/25/2024    10:31 AM 02/05/2024     2:10 PM   PHQ-9    Little interest or pleasure in doing things 2 1 1    Feeling down, depressed, or hopeless 1 1 1    Trouble falling or staying asleep, or sleeping too much 1 1 1    Feeling tired or having little energy 2 1 1    Poor appetite or overeating 1 0 2   Feeling bad about yourself - or that you are a failure or have let yourself or your family down 1 1 1    Trouble concentrating on things, such as reading the newspaper or watching television 1 1 1    Moving or speaking so slowly that other people could have noticed. Or the opposite - being so fidgety or restless that you have been moving around a lot more than usual 2 2 2    Thoughts that you would be better off dead, or of hurting yourself in some way 0 0 0   PHQ-2 Score 3  2  2      PHQ-9 Total Score 11  8  10      If you checked off any problems, how difficult have these problems made it for you to do your work, take care of things at home, or get along with other people? 1 1 1        Patient-reported    Data saved with a previous flowsheet row definition           07/29/2024    11:36 AM 04/25/2024    10:30 AM 02/05/2024     2:08 PM   GAD-7 SCREENING   Feeling nervous, anxious, or on edge Several days Several days Several days   Not being able to stop or control worrying Several days Several days Several days   Worrying too much about different things Several days More than half the days Several days   Trouble relaxing Several days Several days Several days   Being so restless that it is hard to sit still More than half the days Several days Several days   Becoming easily annoyed or irritable Several days Several days Several days   Feeling afraid as if something awful might happen Several days  Several days Several days   GAD-7 Total Score 8  8  7     How difficult have these problems made it for you to do your work, take care of things at home, or get along with other people? Somewhat difficult Somewhat difficult Somewhat difficult       Patient-reported           Chief complaint:  The patient is a 43 year old married American [place] female seen for a follow-up for major depression, generalized anxiety with panic attacks, and primary insomnia.  Subjective:  History of Present Illness  The patient is a 43 year old married American [place] female seen for a follow-up for major depression, generalized anxiety with panic attacks, and primary insomnia.    She reports that her depression and anxiety are currently under control, attributing any stress to normal  life events. However, she is experiencing increased stress due to her mother's upcoming hip surgery, which will require her to be out of town for up to 8 weeks. She has been on clonazepam  for 27 years and expresses concern about potential withdrawal symptoms if she forgets to take it while away. She recalls an incident years ago when she went without the medication for 3 days, resulting in difficulty breathing and a sensation of her heart wanting to explode. She is apprehensive about similar symptoms occurring while caring for her mother. She is considering reducing her clonazepam  dosage from 3 times daily to twice daily but is unsure of the potential effects. She is also contemplating discontinuing all medications. She is still grieving the loss of her father and is worried about how she will cope with the additional stress of her mother's surgery.    She is currently taking Pristiq  50 mg and Wellbutrin , having discontinued Zoloft . She is also taking hydrochlorothiazide  for blood pressure management. She does not have insurance and is seeking advice on whether she can get enough medication to last her the entire 8 weeks or if she will need to return home  to refill her prescriptions. She is also interested in Publishing rights manager options.    Marital Status: Married  Occupation: Works full-time for her [employer]  Living Condition: Lives with her husband    GYNECOLOGICAL HISTORY:  Last Menstrual Period: 06/28/2024  Cycle Length: 28 to 29 days         Patient Active Problem List   Diagnosis    Anxiety and depression    Hypertension    Class 3 severe obesity without serious comorbidity with body mass index (BMI) of 40.0 to 44.9 in adult [facility])    Tinnitus of both ears    Intractable migraine without aura and without status migrainosus    Class 1 obesity due to excess calories with serious comorbidity and body mass index (BMI) of 34.0 to 34.9 in adult     LMP 06/28/24, Birth control,   Not pregnant    Denies palpitation,SOB, Chest pain, headaches. In no acute distress.     Review of Systems - Psychiatric    SLEEP:   []  GOOD   [x]  FAIR   []  POOR      APPETITE:   []  GOOD   [x]  FAIR   []  ERRATIC       BP 120/78 (BP Site: Left Upper Arm, Patient Position: Sitting, BP Cuff Size: Large Adult)   Pulse 72   Temp 97.4 F (36.3 C) (Temporal)   Ht 1.651 m (5' 5)   Wt 82.6 kg (182 lb)   SpO2 99%   BMI 30.29 kg/m       MEDICATION REVIEW:    Current Medications:    Current Outpatient Medications   Medication Sig    buPROPion  (WELLBUTRIN  XL) 300 MG extended release tablet Take 1 tablet by mouth daily (with breakfast)    clonazePAM  (KLONOPIN ) 0.5 MG tablet Take 1 tablet by mouth 3 times daily as needed for Anxiety for up to 120 days. Max Daily Amount: 1.5 mg    desvenlafaxine  succinate (PRISTIQ ) 50 MG TB24 extended release tablet Take 1 tablet by mouth daily    hydroCHLOROthiazide  (HYDRODIURIL ) 25 MG tablet Take 1 tablet by mouth once daily    vitamin C (ASCORBIC ACID) 500 MG tablet Take 2 tablets by mouth daily    methylfolate (DEPLIN) 7.5 MG TABS tablet Take 7.5 tablets by mouth daily  Omega-3 Fatty Acids (OMEGA 3 PO) Take 1,000 mg by mouth daily    MAGNESIUM  PO Take by mouth    Prenatal Vit-Fe Fumarate-FA (PRENATAL PO) Take by mouth Includes DHA    vitamin D3 (CHOLECALCIFEROL) 125 MCG (5000 UT) TABS tablet Take by mouth daily    calcium carbonate (TUMS) 500 MG chewable tablet Take 1 tablet by mouth daily    Melatonin 1 MG CHEW Take by mouth    predniSONE  5 MG (48) TBPK Take as directed per package instructions    Melatonin 5 MG CAPS Take by mouth     No current facility-administered medications for this visit.       Allergies   Allergen Reactions    Levonorgestrel-Ethinyl Estrad Itching     Denies allergy 02/22/22    Hydrocodone-Acetaminophen Nausea Only and Swelling       Past Medical History, Past Surgical History, Family history, Social History, and Medications were all reviewed with the patient today and updated as necessary. Where available I have reviewed outside charts in epic and I have referenced care everywhere where possible.     Compliant with medication: Yes   Side effects from medications:  No           No data to display                   EXAMINATION    Musculoskeletal    GAIT AND STATION   [x]  WNL   []  RESTRICTED   []  UNSTEADY WALK        []  ABNORMAL   []  UNBALANCED  []  WHEELCHAIR/  WALKER/CANE       MUSCLE STRENGTH AND TONE   [x]  WNL   []  ATROPHY   []  SPASTIC        []  FLACCID   []  COGWHEEL       ABNORMAL MOVEMENTS:   [x]  NONE   []  TICS   []  TREMORS   []  BIZZARE      []  FACE   []  TRUNK   []  EXTREMETIES   []  GESTURES     PSYCHIATRIC    PSYCHOMOTOR   [x]   WNL   []  RETARDATION   []  AGITATION            GENERAL APPEARANCE:   [x]   WELL GROOMED []      DISHEVELED   []   UNKEMPT      []   UNUSUAL/BIZZARE    []  WNL       ATTITUDE:   [x]  COOPERATIVE   []  GUARDED   []  SUSPICIOUS      []  HOSTILE                            BEHAVIOR:   [x]  CALM   []  HYPERACTIVE   []  MANNERISMS      []  BIZZARE         SPEECH:   [x]  NORMAL FOR CLIENT   []  SPONTANEOUS   []  SLURRED   []  WHISPERING      []  LOUD   []  PRESSURED   []  ARTICULATE        EYE CONTACT:   [x]  WNL   []  BLANK STARE    []  INTENSE      []  AVOIDANT         MOOD:   []  EUTHYMIC   [x]  ANXIOUS   [x]  DEPRESSED      []  IRRITABLE   []  ANGRY   []   APATHETIC     AFFECT:   [x]  CONGRUENT WITH MOOD   []  FLAT   []  CONSTRICTED      []  INAPPROPRIATE   []  LABILE           THOUGHT PROCESS:   [x]  LOGICAL/GOAL-DIRECTED   []  FOI   []  CIRCUMSANTIAL      []  INCOHERENT   []  TANGENTIAL   []  CONCRETE      []  PERSEVERATION           ASSOCIATIONS  [x]  INTACT  []  LOOSE           THOUGHT CONTENT:                DELUSIONS  [x]  DENIES  []  GRANDIOSE  []  PERSECUTORY  []  RELIGIOUS  []  REFERENCE   HALLUCINATIONS  [x]  DENIES  []  AUDITORY  []  VISUAL  []  OLFACTORY  []  TACTILE     []  GUSTATORY  []  SOMATIC         OBSESSIONS  [x]  DENIES  []  PRESENT         SUICIDAL IDEATION  [x]  DENIES  []  PRESENT W/O PLAN  []  PRESENT W/ PLAN       HOMICIDAL IDEATION  [x]  DENIES  []  PRESENT W/O PLAN  []  PRESENT W/ PLAN           JUDGEMENT:   [x]  GOOD   []  FAIR   []  POOR   INSIGHT:   [x]  GOOD   []  FAIR   []  POOR     COGNITION:           SENSORIUM:   [x]  ALERT   []  CLOUDED   []  DROWSY     ORIENTATION:   [x]  INTACT   []  TIME:  PLACE  PERSON   RECENT & REMOTE MEMORY:   [x]  NORMAL   []  OTHER:                  ATTENTION:   []  INTACT   [x]  MILD IMPAIRMENT   []  SEVERE IMPAIRMENT     CONCENTRATION:   []  INTACT   [x]  MILD IMPAIRMENT   []  SEVERE IMPAIRMENT     LANGUAGE:   [x]  AVERAGE   []  ABOVE AVERAGE   []  BELOW AVERAGE     FUND OF KNOWLEDGE:   []  UNABLE TO ASSESS AT THIS TIME   [x]  AVERAGE   []  ABOVE AVERAGE   []  BELOW AVERAGE      []  GOOD TO EXCELLENT KNOWLEDGE OF CURRENT EVENTS   []  POOR TO NO KNLEDGE OF CURRENT EVENTS                                            Diagnoses/Impressions:    ICD-10-CM    1. Mild episode of recurrent major depressive disorder  F33.0       2. Generalized anxiety disorder with panic attacks  F41.1 clonazePAM  (KLONOPIN ) 0.5 MG tablet    F41.0       3. Primary insomnia  F51.01 clonazePAM  (KLONOPIN ) 0.5 MG tablet      4. Psychosocial stressors  Z65.8           TREATMENT  GOALS:  Symptom reduction, Medication adherence, maintain therapeutic gains    LABS/IMAGING:    []   Ordered [x]   Reviewed []   New Labs Ordered:     LAB  WBC  Date/Time Value Ref Range Status   07/25/2023 11:28 AM 8.2 4.3 - 11.1 K/uL Final     Hemoglobin   Date/Time Value Ref Range Status   07/25/2023 11:28 AM 11.8 11.7 - 15.4 g/dL Final     Hematocrit   Date/Time Value Ref Range Status   07/25/2023 11:28 AM 36.8 35.8 - 46.3 % Final     Platelets   Date/Time Value Ref Range Status   07/25/2023 11:28 AM 289 150 - 450 K/uL Final     Sodium   Date/Time Value Ref Range Status   07/25/2023 11:28 AM 139 136 - 145 mmol/L Final     Potassium   Date/Time Value Ref Range Status   07/25/2023 11:28 AM 4.0 3.5 - 5.1 mmol/L Final     Chloride   Date/Time Value Ref Range Status   07/25/2023 11:28 AM 106 98 - 107 mmol/L Final     CO2   Date/Time Value Ref Range Status   07/25/2023 11:28 AM 26 20 - 28 mmol/L Final     BUN   Date/Time Value Ref Range Status   07/25/2023 11:28 AM 8 6 - 23 MG/DL Final     ALT   Date/Time Value Ref Range Status   07/25/2023 11:28 AM 12 12 - 65 U/L Final     AST   Date/Time Value Ref Range Status   07/25/2023 11:28 AM 25 15 - 37 U/L Final     TSH   Date/Time Value Ref Range Status   09/01/2020 03:02 PM 0.818 0.450 - 4.500 uIU/mL Final       Please refer to the lab tab in the epic and care everywhere for the most recent lab results.    Plan:   Assessment & Plan  1. Major depression:  - She reports that her depression is currently stable with normal life stresses.  - She is currently taking Pristiq  50 mg and Wellbutrin .  - She was advised to continue her current medication regimen.    2. Generalized anxiety with panic attacks:  - She expressed concerns about managing her anxiety while being out of town for her mother's surgery.  - She was reassured that a prescription can be sent to a pharmacy in [place], [place] , if needed. She was advised to provide the name and phone number of the pharmacy so  that a prescription for a month's supply can be sent there.    3. Primary insomnia:  - She was advised to continue her current medication regimen for insomnia.    4. Medication management:  - She was informed that Klonopin  is an addictive medication and long-term use can affect memory and potentially lead to dementia. She was advised to gradually reduce her Klonopin  dosage by one tablet every 3 to 4 months.  - A prescription for a month's supply of Klonopin  was provided, with instructions to take it twice daily instead of three times daily. She was cautioned against abrupt discontinuation of Klonopin  due to the risk of withdrawal seizures.         [x]   Medication ordered: Wellbutrin , Pristiq , Klonopin  to target depression, anxiety, insomnia.     [x]   Medication education/counseling provided to client  Medication dosage and time to take, purpose/expected benefits/risks, common side effects, lab monitoring required and reason, expected length of treatment, risk of no treatment, effects on pregnancy/nursing, financial availability discussed. Educated patient on side effects/risks/benefits of meds including cardiac arrhythmias, suicidal ideations, orthostatic hypotension, serotonin syndrome, risk of mania/hypomania from  antidepressants, withdrawals from abrupt discontinuation of meds, risk of bleeding, risk of seizures, addiction potential, memory impairment with long term use of benzos, respiratory depression, high blood pressure, dizziness, drowsiness, sedation , risk of falls, Risk & benefits discussed: including but not limited to possible off-label medication usage.     [x]  Patient encouraged to contact the clinic if experiencing any adverse reactions with medications.     [x]  Follow MSE for sxs improvement     I have reviewed the patient's controlled substance prescription history, as maintained in the [place]  prescription monitoring program, so that the prescription(s) for a  controlled substance can be  given.  Controlled substance agreement reviewed.  .     Recommendations and Referrals:    Follow up with : MD, requires monitoring of response to medication, requires monitoring of medication side effects.    Time until next PMA: 3 months    Follow up with Mental Health Clinicians recommended for : psychotherapy interventions,  monitoring to prevent decompensation /hospitalization, monitoring to maintain therapeutic gains, monitoring symptoms (resolving and controlled), to improve level of functioning,    Referral: None    Emergency admission to inpatient psychiatric care: N/A        Psychotherapy note:                                __10_ Minutes of psychotherapy     [x]   Supportive psychotherapy provided to improve self-esteem, psychological functioning, and adaptive skills.  Patient discussed certain situational and personal stressors ongoing in her life at this time, weight management d/w the patient. Sleep hygiene d/w patient. Patient allowed to vent out her emotions. Active listening were provided,   Scenarios were reviewed using CBT  techniques in order to increase insight and decrease anxiety.    Dysfunctional cognitions challenged and alternate options discussed. Strengths were validated and reinforced.   Discussed working on coping skills and relaxation techniques (meditation, mindfulness, breathing exercises) to help mitigate anxiety and improve sleep. Suggested reading, listening to music, outdoor activities,  exercise as tolerated, puzzles, art (drawing, coloring, painting) to alleviate anxiety.      []   Disposition planning      []   Dangerous and will not contract for safety in the community    **[place] has been notified: They are to call 911 or go to their nearest E.R. if they are experiencing a medical emergency or suicidal ideations/homicidal ideations.**  All ancillary documentation entered reviewed by provider.      PLEASE NOTE:  This document has been produced in part or whole using voice  recognition software. Proofread however unrecognized errors in transcription may be present.    Carolann Shark, MD

## 2024-07-30 ENCOUNTER — Encounter: Attending: Internal Medicine | Primary: Internal Medicine

## 2024-08-05 ENCOUNTER — Encounter: Attending: Internal Medicine | Primary: Internal Medicine

## 2024-08-05 ENCOUNTER — Encounter

## 2024-08-05 NOTE — Progress Notes (Unsigned)
 [place] Keyra Virella, D.O.   [facility]  [address]  [place], [place]  [address]  Tel: [phone]    History and Physical Office Visit     Patient Name: Karen Logan    DOB:  09/18/1981   MRN:   [id number]      Today's Date: 08/05/24 10:11 AM    Subjective     The patient is a 43 y.o. year old female with a pmh as listed below. The patient presents today for a physical.    Diet:  Exercise:  Alcohol:  Cigarettes:  Sexually active:   Occupation:    SAFETY PLAN:  The patient lives at home with her spouse/family.  she feels safe in her house.  she denies any history of suicidal or homicidal ideations. she denies any thoughts of harming self or others. she denies any thoughts that she would be better off dead. she states she has a good support system with family and friends in the area.    Primary hypertension  - Hydrochlorothiazide  25 mg daily     Anxiety and depression, well controlled   - Zoloft  200 mg daily  - Klonopin  0.5 mg 3 times daily as needed  - Wellbutrin  XL 300 mg daily  - Patient is following with Dr. Lucien, last seen 07/29/2024.  - patient is following with psychiatry q3 months.  - Patient has follow-up with psychiatry 10/29/2024     Obesity  - BMI 34.61, weight 208 pounds, down 50 pounds since January 2023  -     Health maintenance  - Mammogram due  - Pap smear due  - Colonoscopy due 09/26/2026    New complaints:  HPI    Review of Systems     Past Medical History:   Diagnosis Date    Allergies     Anxiety     Anxiety and depression     Arthritis 2020    Left knee    Bilateral calf pain 09/01/2020    Bladder problem     Carpal tunnel syndrome     Chronic bilateral low back pain without sciatica 09/05/2022    Chronic neck pain 09/05/2022    Class 1 obesity due to excess calories with serious comorbidity and body mass index (BMI) of 34.0 to 34.9 in adult 07/25/2023    Class 3 severe obesity without serious comorbidity with body mass index (BMI) of 40.0 to 44.9 in adult [facility])  09/01/2020    Depression     Encounter to establish care 09/01/2020    Headache     Heart disease father    Hypertension     Intractable migraine without aura and without status migrainosus 03/15/2021    Panic attack 1997    Tinnitus of both ears 09/01/2020     Social History     Socioeconomic History    Marital status: Married     Spouse name: Not on file    Number of children: Not on file    Years of education: Not on file    Highest education level: Not on file   Occupational History    Not on file   Tobacco Use    Smoking status: Never     Passive exposure: Never    Smokeless tobacco: Never   Vaping Use    Vaping status: Never Used   Substance and Sexual Activity    Alcohol use: Never    Drug use: Never    Sexual activity: Yes  Partners: Male     Comment: My husband   Other Topics Concern    Not on file   Social History Narrative    Not on file     Social Drivers of Health     Financial Resource Strain: Low Risk  (07/25/2023)    Overall Financial Resource Strain (CARDIA)     Difficulty of Paying Living Expenses: Not hard at all   Food Insecurity: No Food Insecurity (07/25/2023)    Hunger Vital Sign     Worried About Running Out of Food in the Last Year: Never true     Ran Out of Food in the Last Year: Never true   Transportation Needs: Unknown (07/25/2023)    PRAPARE - Therapist, art (Medical): Not on file     Lack of Transportation (Non-Medical): No   Physical Activity: Not on file   Stress: Not on file   Social Connections: Not on file   Intimate Partner Violence: Not on file   Housing Stability: Unknown (07/25/2023)    Housing Stability Vital Sign     Unable to Pay for Housing in the Last Year: Not on file     Number of Times Moved in the Last Year: Not on file     Homeless in the Last Year: No         Current Outpatient Medications   Medication Sig    buPROPion  (WELLBUTRIN  XL) 300 MG extended release tablet Take 1 tablet by mouth daily (with breakfast)    clonazePAM  (KLONOPIN ) 0.5 MG  tablet Take 1 tablet by mouth 3 times daily as needed for Anxiety for up to 120 days. Max Daily Amount: 1.5 mg    desvenlafaxine  succinate (PRISTIQ ) 50 MG TB24 extended release tablet Take 1 tablet by mouth daily    hydroCHLOROthiazide  (HYDRODIURIL ) 25 MG tablet Take 1 tablet by mouth once daily    vitamin C (ASCORBIC ACID) 500 MG tablet Take 2 tablets by mouth daily    calcium carbonate (TUMS) 500 MG chewable tablet Take 1 tablet by mouth daily    Melatonin 1 MG CHEW Take by mouth    methylfolate (DEPLIN) 7.5 MG TABS tablet Take 7.5 tablets by mouth daily    Omega-3 Fatty Acids (OMEGA 3 PO) Take 1,000 mg by mouth daily    predniSONE  5 MG (48) TBPK Take as directed per package instructions    MAGNESIUM PO Take by mouth    Prenatal Vit-Fe Fumarate-FA (PRENATAL PO) Take by mouth Includes DHA    vitamin D3 (CHOLECALCIFEROL) 125 MCG (5000 UT) TABS tablet Take by mouth daily    Melatonin 5 MG CAPS Take by mouth     No current facility-administered medications for this visit.        Objective     There were no vitals filed for this visit.     Physical Exam:  Constitutional: Appears well kempt. Alert/oriented x3. In no acute distress.  Head: Normocephalic No trauma. No deformity.   Neck: Supple. ROM normal. No tenderness. No masses.  Eyes: PERRLA. Conjunctivae normal. No discharge.  Ears: External ears normal. TM normal. No discharge from ears.   Nose: Nose normal. Nares patent.   Throat: Clear. No exudates. No erythema.   Cardiac: Heart with normal rate/rhythm. No murmurs. No gallops. Pulses normal.   Pulmonary: Lungs clear to auscultation bilaterally. In no respiratory distress. No wheezing. No rales. No rhonci.   Gastrointestinal: Bowel sounds present. Abdomen soft and nondistended.  Musculoskeletal: Moves all extremities with good ROM. Non-tender. No swelling. No edema.   Neurological: No numbness. No tingling. Alert and oriented. At baseline. No confusion. Patellar Reflexes 2+.  Psychiatric: Normal thought content.  Normal behavior. Normal judgment.     Recommendations     Assessment:  Patient Active Problem List   Diagnosis    Anxiety and depression    Hypertension    Tinnitus of both ears    Intractable migraine without aura and without status migrainosus    Class 1 obesity due to excess calories with serious comorbidity and body mass index (BMI) of 30.0 to 30.9 in adult        Status of Medical Conditions:    Plan:  -    -Previous medical records and/or labs/tests available to me reviewed, any records outstanding not available requested  -The risks, benefits, and medical necessity of all medications, tests labs, and any other orders that were ordered at today's visit were discussed with the patient including all elective or patient requested orders.  Decision to order or not order tests or based on this risk/benefit ratio and medical necessity.  -The patient expresses understanding of the plan as I've explained it to her and is in agreement with the current plan.    The patient was seen today for the annual preventive health visit.  The patient was provided anticipatory guidance and counseling regarding age / sex appropriate health maintenance issues including vaccinations, blood pressure screening, lipid screening, cancer screenings, diet, exercise, avoidance of tobacco / substance abuse.    Follow up:    Total Time: 45 minutes (chart review and in-exam time)    Signed: [place] Shekera Beavers, D.O.  08/05/24  10:11 AM

## 2024-10-14 ENCOUNTER — Ambulatory Visit: Admit: 2024-10-14 | Discharge: 2024-10-14 | Attending: Internal Medicine | Primary: Internal Medicine

## 2024-10-14 ENCOUNTER — Encounter

## 2024-10-14 MED ORDER — HYDROCHLOROTHIAZIDE 25 MG PO TABS
25 | ORAL_TABLET | Freq: Every day | ORAL | 2 refills | Status: AC
Start: 2024-10-14 — End: ?

## 2024-10-14 NOTE — Progress Notes (Signed)
 "  Alexine Pilant, D.O.   [facility]  [address]  [place], [place]  [address]  Tel: [phone]    History and Physical Office Visit     Patient Name: Karen Logan    DOB:  14-Jun-1981   MRN:   [id number]      Today's Date: 10/14/24 2:54 PM    Subjective     The patient is a 43 y.o. year old female with a pmh as listed below. The patient presents today for a physical.    Diet: She reports she has been eating very healthy.   Exercise: She reports she is exercise regularly.   Alcohol: none   Cigarettes: none   Sexually active: Married  Occupation: Educational Psychologist     SAFETY PLAN:  The patient lives at home with her spouse/family.  she feels safe in her house.  she denies any history of suicidal or homicidal ideations. she denies any thoughts of harming self or others. she denies any thoughts that she would be better off dead. she states she has a good support system with family and friends in the area.    Primary hypertension  - Hydrochlorothiazide  25 mg daily     Anxiety and depression  - Zoloft  200 mg daily  - Klonopin  0.5 mg 3 times daily as needed  - Wellbutrin  XL 300 mg daily  - Patient is following with Dr. Lucien, last seen 07/29/2024, plan at this time was to continue her current treatment regimen and follow-up in 3 months.  - Patient has follow-up with psychiatry 10/29/2024.  - She reports she feels great today.      Overweight   - BMI 29.79, weight 179 pounds, down 79 pounds since January 2023  - She reports she has been eating very healthy. She reports she is exercise regularly.      Health maintenance  - Mammogram due  - Pap smear due  - Colonoscopy due 09/26/2026    New complaints:  History of Present Illness  No new concerns today.      Review of Systems   All other systems reviewed and are negative.     Past Medical History:   Diagnosis Date    Allergies     Anxiety     Anxiety and depression     Arthritis 2020    Left knee    Bilateral calf pain 09/01/2020    Bladder problem      Carpal tunnel syndrome     Chronic bilateral low back pain without sciatica 09/05/2022    Chronic migraine w/o aura w/o status migrainosus, not intractable 03/15/2021    Chronic neck pain 09/05/2022    Class 1 obesity due to excess calories with serious comorbidity and body mass index (BMI) of 34.0 to 34.9 in adult 07/25/2023    Class 3 severe obesity without serious comorbidity with body mass index (BMI) of 40.0 to 44.9 in adult [facility]) 09/01/2020    Depression     Encounter to establish care 09/01/2020    Headache     Heart disease father    Hypertension     Intractable migraine without aura and without status migrainosus 03/15/2021    Panic attack 1997    Tinnitus of both ears 09/01/2020     Social History     Socioeconomic History    Marital status: Married     Spouse name: Not on file    Number of children: Not on file  Years of education: Not on file    Highest education level: Not on file   Occupational History    Not on file   Tobacco Use    Smoking status: Never     Passive exposure: Never    Smokeless tobacco: Never   Vaping Use    Vaping status: Never Used   Substance and Sexual Activity    Alcohol use: Never    Drug use: Never    Sexual activity: Yes     Partners: Male     Comment: My husband   Other Topics Concern    Not on file   Social History Narrative    Not on file     Social Drivers of Health     Financial Resource Strain: Low Risk  (07/25/2023)    Overall Financial Resource Strain (CARDIA)     Difficulty of Paying Living Expenses: Not hard at all   Food Insecurity: No Food Insecurity (10/14/2024)    Hunger Vital Sign     Worried About Running Out of Food in the Last Year: Never true     Ran Out of Food in the Last Year: Never true   Transportation Needs: No Transportation Needs (10/14/2024)    PRAPARE - Therapist, Art (Medical): No     Lack of Transportation (Non-Medical): No   Physical Activity: Not on file   Stress: Not on file   Social Connections: Not on file   Intimate  Partner Violence: Not on file   Housing Stability: Low Risk  (10/14/2024)    Housing Stability Vital Sign     Unable to Pay for Housing in the Last Year: No     Number of Times Moved in the Last Year: 0     Homeless in the Last Year: No         Current Outpatient Medications   Medication Sig    hydroCHLOROthiazide  (HYDRODIURIL ) 25 MG tablet Take 1 tablet by mouth daily    buPROPion  (WELLBUTRIN  XL) 300 MG extended release tablet Take 1 tablet by mouth daily (with breakfast)    clonazePAM  (KLONOPIN ) 0.5 MG tablet Take 1 tablet by mouth 3 times daily as needed for Anxiety for up to 120 days. Max Daily Amount: 1.5 mg    vitamin C (ASCORBIC ACID) 500 MG tablet Take 2 tablets by mouth daily    Omega-3 Fatty Acids (OMEGA 3 PO) Take 1,000 mg by mouth daily    MAGNESIUM PO Take by mouth    Prenatal Vit-Fe Fumarate-FA (PRENATAL PO) Take by mouth Includes DHA    desvenlafaxine  succinate (PRISTIQ ) 50 MG TB24 extended release tablet Take 1 tablet by mouth daily (Patient not taking: Reported on 10/14/2024)    vitamin D3 (CHOLECALCIFEROL) 125 MCG (5000 UT) TABS tablet Take by mouth daily (Patient not taking: Reported on 10/14/2024)     No current facility-administered medications for this visit.      Objective     Vitals:    10/14/24 1440   BP: 116/70   Pulse: 75   Temp: 97.5 F (36.4 C)   TempSrc: Temporal   SpO2: 98%   Weight: 81.2 kg (179 lb)   Height: 1.651 m (5' 5)        Physical Exam:  Constitutional: Appears well kempt. Alert/oriented x3. In no acute distress.  Head: Normocephalic No trauma. No deformity.   Neck: Supple. ROM normal. No tenderness. No masses.  Eyes: PERRLA. Conjunctivae normal. No discharge.  Ears: External ears normal. TM normal. No discharge from ears.   Nose: Nose normal. Nares patent.   Throat: Clear. No exudates. No erythema.   Cardiac: Heart with normal rate/rhythm. No murmurs. No gallops. Pulses normal.   Pulmonary: Lungs clear to auscultation bilaterally. In no respiratory distress. No wheezing. No  rales. No rhonci.   Gastrointestinal: Bowel sounds present. Abdomen soft and nondistended.   Musculoskeletal: Moves all extremities with good ROM. Non-tender. No swelling. No edema.   Neurological: No numbness. No tingling. Alert and oriented. At baseline. No confusion. Patellar Reflexes 2+.  Psychiatric: Normal thought content. Normal behavior. Normal judgment.     Recommendations     Assessment:  Patient Active Problem List   Diagnosis    Anxiety and depression    Hypertension    Tinnitus of both ears    Chronic migraine w/o aura w/o status migrainosus, not intractable    Class 1 obesity due to excess calories with serious comorbidity and body mass index (BMI) of 30.0 to 30.9 in adult      Status of Medical Conditions: Stable    Plan:    Primary hypertension  - Hydrochlorothiazide  25 mg daily  - Continue current treatment plan     Anxiety and depression  - Zoloft  200 mg daily  - Klonopin  0.5 mg 3 times daily as needed  - Wellbutrin  XL 300 mg daily  - Patient is following with Dr. Lucien, last seen 07/29/2024, plan at this time was to continue her current treatment regimen and follow-up in 3 months.  - Patient has follow-up with psychiatry 10/29/2024.  - She reports she feels great today.   - Continue current treatment plan     Overweight   - BMI 29.79, weight 179 pounds, down 79 pounds since January 2023  - Patient is doing a great job losing weight and congratulated her today.  She states she feels great and is going to keep working towards losing more weight.  - Continue diet and exercise     Health maintenance  - Mammogram ordered  - Referral to OB/GYN close to her house for Pap smear.  - Colonoscopy due 09/26/2026    -Previous medical records and/or labs/tests available to me reviewed, any records outstanding not available requested  -The risks, benefits, and medical necessity of all medications, tests labs, and any other orders that were ordered at today's visit were discussed with the patient including all  elective or patient requested orders.  Decision to order or not order tests or based on this risk/benefit ratio and medical necessity.  -The patient expresses understanding of the plan as I've explained it to her and is in agreement with the current plan.    The patient was seen today for the annual preventive health visit.  The patient was provided anticipatory guidance and counseling regarding age / sex appropriate health maintenance issues including vaccinations, blood pressure screening, lipid screening, cancer screenings, diet, exercise, avoidance of tobacco / substance abuse.    Follow up: 6 months    Total Time: 45 minutes (chart review and in-exam time)    Signed: Garrus Gauthreaux, D.O.  10/14/24  2:54 PM  "

## 2024-10-15 ENCOUNTER — Encounter

## 2024-10-15 LAB — LIPID PANEL
Chol/HDL Ratio: 2.8 (ref 0.0–5.0)
Cholesterol, Total: 188 mg/dL (ref 0–200)
HDL: 67 mg/dL — ABNORMAL HIGH (ref 40–60)
LDL Cholesterol: 109 mg/dL — ABNORMAL HIGH (ref 0–100)
Triglycerides: 61 mg/dL (ref 0–150)
VLDL Cholesterol Calculated: 12 mg/dL (ref 6–23)

## 2024-10-15 LAB — HIV 1/2 AG/AB, 4TH GENERATION,W RFLX CONFIRM: HIV 1/2 Interp: NONREACTIVE

## 2024-10-15 LAB — COMPREHENSIVE METABOLIC PANEL
ALT: 20 U/L (ref 8–45)
AST: 18 U/L (ref 15–37)
Albumin/Globulin Ratio: 1.2 (ref 1.0–1.9)
Albumin: 3.9 g/dL (ref 3.5–5.0)
Alk Phosphatase: 57 U/L (ref 35–104)
Anion Gap: 11 mmol/L (ref 7–16)
BUN: 4 mg/dL — ABNORMAL LOW (ref 6–23)
CO2: 25 mmol/L (ref 20–29)
Calcium: 10.4 mg/dL — ABNORMAL HIGH (ref 8.8–10.2)
Chloride: 101 mmol/L (ref 98–107)
Creatinine: 0.72 mg/dL (ref 0.60–1.10)
Est, Glom Filt Rate: >90 ml/min/1.73m2 (ref >60)
Globulin: 3.2 g/dL (ref 2.3–3.5)
Glucose: 139 mg/dL — ABNORMAL HIGH (ref 70–99)
Potassium: 3.2 mmol/L — ABNORMAL LOW (ref 3.5–5.1)
Sodium: 136 mmol/L (ref 136–145)
Total Bilirubin: 0.5 mg/dL (ref 0.0–1.2)
Total Protein: 7.1 g/dL (ref 6.3–8.2)

## 2024-10-15 LAB — CBC WITH AUTO DIFFERENTIAL
Basophils %: 0.5 % (ref 0.0–2.0)
Basophils Absolute: 0.06 K/UL (ref 0.00–0.20)
Eosinophils %: 0.9 % (ref 0.5–7.8)
Eosinophils Absolute: 0.1 K/UL (ref 0.00–0.80)
Hematocrit: 41.6 % (ref 35.8–46.3)
Hemoglobin: 13.4 g/dL (ref 11.7–15.4)
Immature Granulocytes %: 0.4 % (ref 0.0–5.0)
Immature Granulocytes Absolute: 0.04 K/UL (ref 0.0–0.5)
Lymphocytes %: 20.8 % (ref 13.0–44.0)
Lymphocytes Absolute: 2.3 K/UL (ref 0.50–4.60)
MCH: 27.7 pg (ref 26.1–32.9)
MCHC: 32.2 g/dL (ref 31.4–35.0)
MCV: 86 FL (ref 82–102)
MPV: 11.5 FL (ref 9.4–12.3)
Monocytes %: 4.5 % (ref 4.0–12.0)
Monocytes Absolute: 0.5 K/UL (ref 0.10–1.30)
Neutrophils %: 72.9 % (ref 43.0–78.0)
Neutrophils Absolute: 8.07 K/UL (ref 1.70–8.20)
Platelets: 356 K/uL (ref 150–450)
RBC: 4.84 M/uL (ref 4.05–5.2)
RDW: 15 % — ABNORMAL HIGH (ref 11.9–14.6)
WBC: 11.1 K/uL (ref 4.3–11.1)
nRBC: 0 K/uL (ref 0.0–0.2)

## 2024-10-15 MED ORDER — POTASSIUM CHLORIDE CRYS ER 20 MEQ PO TBCR
20 | ORAL_TABLET | Freq: Two times a day (BID) | ORAL | 1 refills | Status: AC
Start: 2024-10-15 — End: ?

## 2024-10-15 NOTE — Result Encounter Note (Signed)
"  Patient's potassium is low, this is probably due to her blood pressure medication.  I would like to start her on potassium pills and recheck her potassium next week.  The rest of her labs are stable.    Please consult patient for labs at the end of next week.  "

## 2024-10-22 ENCOUNTER — Encounter: Admit: 2024-10-22 | Discharge: 2024-10-22 | Primary: Internal Medicine

## 2024-10-22 ENCOUNTER — Encounter

## 2024-10-23 LAB — COMPREHENSIVE METABOLIC PANEL
ALT: 13 U/L (ref 8–45)
AST: 17 U/L (ref 15–37)
Albumin/Globulin Ratio: 1.2 (ref 1.0–1.9)
Albumin: 3.6 g/dL (ref 3.5–5.0)
Alk Phosphatase: 52 U/L (ref 35–104)
Anion Gap: 6 mmol/L — ABNORMAL LOW (ref 7–16)
BUN: 5 mg/dL — ABNORMAL LOW (ref 6–23)
CO2: 27 mmol/L (ref 20–29)
Calcium: 10 mg/dL (ref 8.8–10.2)
Chloride: 105 mmol/L (ref 98–107)
Creatinine: 0.68 mg/dL (ref 0.60–1.10)
Est, Glom Filt Rate: >90 ml/min/1.73m2 (ref >60)
Globulin: 3 g/dL (ref 2.3–3.5)
Glucose: 105 mg/dL — ABNORMAL HIGH (ref 70–99)
Potassium: 3.9 mmol/L (ref 3.5–5.1)
Sodium: 138 mmol/L (ref 136–145)
Total Bilirubin: 0.4 mg/dL (ref 0.0–1.2)
Total Protein: 6.6 g/dL (ref 6.3–8.2)

## 2024-10-23 NOTE — Result Encounter Note (Signed)
"  Patient's potassium now normal.  Patient's calcium also normal.  "

## 2024-10-29 ENCOUNTER — Telehealth: Admit: 2024-10-29 | Discharge: 2024-10-29 | Attending: Psychiatry | Primary: Internal Medicine

## 2024-10-29 DIAGNOSIS — F3341 Major depressive disorder, recurrent, in partial remission: Principal | ICD-10-CM

## 2024-10-29 NOTE — Progress Notes (Signed)
 "            Patient:  Karen Logan  Age:  43 y.o.  DOB:  Oct 11, 1981     SEX:  female MRN:  [id number]     RACE: American Indian / [place] Native     SEEN:  [x]   PATIENT  []   SPOUSE []   OTHER:                  10/29/2024     2:11 PM 07/29/2024    11:39 AM 04/25/2024    10:31 AM   PHQ-9    Little interest or pleasure in doing things 0 2 1   Feeling down, depressed, or hopeless 1 1 1    Trouble falling or staying asleep, or sleeping too much 0 1 1   Feeling tired or having little energy 0 2 1   Poor appetite or overeating 0 1 0   Feeling bad about yourself - or that you are a failure or have let yourself or your family down 0 1 1   Trouble concentrating on things, such as reading the newspaper or watching television 1 1 1    Moving or speaking so slowly that other people could have noticed. Or the opposite - being so fidgety or restless that you have been moving around a lot more than usual 1 2 2    Thoughts that you would be better off dead, or of hurting yourself in some way 0 0 0   PHQ-2 Score 1 3  2     PHQ-9 Total Score 3 11  8     If you checked off any problems, how difficult have these problems made it for you to do your work, take care of things at home, or get along with other people? 0 1 1       Patient-reported           10/29/2024     2:17 PM 07/29/2024    11:36 AM 04/25/2024    10:30 AM   GAD-7 SCREENING   Feeling nervous, anxious, or on edge Not at all Several days Several days   Not being able to stop or control worrying Not at all Several days Several days   Worrying too much about different things Not at all Several days More than half the days   Trouble relaxing Not at all Several days Several days   Being so restless that it is hard to sit still Not at all More than half the days Several days   Becoming easily annoyed or irritable Not at all Several days Several days   Feeling afraid as if something awful might happen Nearly every day Several days Several days   GAD-7 Total Score 3 8  8     How difficult  have these problems made it for you to do your work, take care of things at home, or get along with other people? Not difficult at all Somewhat difficult Somewhat difficult       Patient-reported            I was at home while conducting this encounter.    Consent:  She and/or her healthcare decision maker is aware that this patient-initiated Telehealth encounter is a billable service, with coverage as determined by her insurance carrier. She is aware that she may receive a bill and has provided verbal consent to proceed: YesPatient identification was verified, and a caregiver was present when appropriate. The patient was located in a state  where the provider was credentialed to provide care.    This virtual visit was conducted via MyChart. Pursuant to the emergency declaration under the [facility] Act and the [employer], 1135 waiver authority and the [employer] Act, this Virtual  Visit was conducted to reduce the patient's risk of exposure to COVID-19 and provide continuity of care for an established patient. Services were provided through a video synchronous discussion virtually to substitute for in-person clinic visit.  Due to this being a TeleHealth evaluation, many elements of the physical examination are unable to be assessed.     The patient (or guardian, if applicable) and other individuals in attendance with the patient were advised that Artificial Intelligence will be utilized during this visit to record, process the conversation to generate a clinical note, and support improvement of the AI technology. The patient (or guardian, if applicable) and other individuals in attendance at the appointment consented to the use of AI, including the recording.       Chief complaint:  The patient is a 43 year old married African-American female who presents for a follow-up for major depressive disorder, generalized anxiety disorder with panic attacks,  and primary insomnia.    Subjective:  History of Present Illness  The patient is a 43 year old married African-American female who presents for a follow-up for major depressive disorder, generalized anxiety disorder with panic attacks, and primary insomnia.    She reports significant improvement in her condition, attributing it to the current medication regimen, which includes Pristiq  and Klonopin . She expresses apprehension about altering this regimen due to its effectiveness. She has been engaging in reading and setting personal boundaries, which have been respected by others. She finds solace in outdoor activities and believes that spending time with her mother has been beneficial for her mental health.    She has been experiencing issues with certain individuals at her workplace who exhibit aggressive behavior, causing her stress. However, she manages to maintain composure when these individuals are not present. She has noticed a change in her behavior since the last visit, characterized by reluctance to visit crowded places such as grocery stores, gas stations, or malls alone. She experiences a sensation akin to acrophobia in her hands and feet when in large crowds. Despite adhering to her medication regimen and performing breathing exercises, she finds that these symptoms persist. This is a new development, as she previously felt comfortable in crowded environments during her 20-year tenure at [employer]. She notes that these symptoms are more pronounced in enclosed spaces like stores, but less so in open areas like parks.    Her sleep pattern has normalized, negating the need for Tylenol PM or melatonin.    Marital Status: Married  Occupation: Works for her [employer] and has a part-time job at [employer]: Reports normalized sleep pattern         Patient Active Problem List   Diagnosis    Anxiety and depression    Hypertension    Chronic migraine w/o aura w/o status migrainosus, not intractable     Overweight (BMI 25.0-29.9)     LMP, Birth control,   Not pregnant    Denies palpitation,SOB, Chest pain, headaches. In no acute distress.     Review of Systems - Psychiatric    SLEEP:   [x]  GOOD   []  FAIR   []  POOR      APPETITE:   [x]  GOOD   []  POOR   []  ERRATIC  There were no vitals taken for this visit.      MEDICATION REVIEW:    Current Medications:    Current Outpatient Medications   Medication Sig    [START ON 11/26/2024] buPROPion  (WELLBUTRIN  XL) 300 MG extended release tablet Take 1 tablet by mouth daily (with breakfast)    [START ON 11/26/2024] desvenlafaxine  succinate (PRISTIQ ) 50 MG TB24 extended release tablet Take 1 tablet by mouth daily    [START ON 11/26/2024] clonazePAM  (KLONOPIN ) 0.5 MG tablet Take 1 tablet by mouth 2 times daily as needed for Anxiety for up to 90 days. Max Daily Amount: 1 mg    potassium chloride  (KLOR-CON  M) 20 MEQ extended release tablet Take 1 tablet by mouth 2 times daily    hydroCHLOROthiazide  (HYDRODIURIL ) 25 MG tablet Take 1 tablet by mouth daily    vitamin C (ASCORBIC ACID) 500 MG tablet Take 2 tablets by mouth daily    Omega-3 Fatty Acids (OMEGA 3 PO) Take 1,000 mg by mouth daily    Prenatal Vit-Fe Fumarate-FA (PRENATAL PO) Take by mouth Includes DHA    vitamin D3 (CHOLECALCIFEROL) 125 MCG (5000 UT) TABS tablet Take by mouth daily    MAGNESIUM PO Take by mouth (Patient not taking: Reported on 10/29/2024)     No current facility-administered medications for this visit.       Allergies   Allergen Reactions    Levonorgestrel-Ethinyl Estrad Itching     Denies allergy 02/22/22    Hydrocodone-Acetaminophen Nausea Only and Swelling       Past Medical History, Past Surgical History, Family history, Social History, and Medications were all reviewed with the patient today and updated as necessary. Where available I have reviewed outside charts in epic and I have referenced care everywhere where possible.     Compliant with medication: Yes   Side effects from medications:   No           No data to display                   EXAMINATION    Musculoskeletal    GAIT AND STATION   []  WNL   []  RESTRICTED   []  UNSTEADY WALK        []  ABNORMAL   []  UNBALANCED  []  WHEELCHAIR/  WALKER/CANE       MUSCLE STRENGTH AND TONE   []  WNL   []  ATROPHY   []  SPASTIC        []  FLACCID   []  COGWHEEL       ABNORMAL MOVEMENTS:   [x]  NONE   []  TICS   []  TREMORS   []  BIZZARE      []  FACE   []  TRUNK   []  EXTREMETIES   []  GESTURES     PSYCHIATRIC    PSYCHOMOTOR   [x]   WNL   []  RETARDATION   []  AGITATION            GENERAL APPEARANCE:   [x]   WELL GROOMED []      DISHEVELED   []   UNKEMPT      []   UNUSUAL/BIZZARE    []  WNL       ATTITUDE:   [x]  COOPERATIVE   []  GUARDED   []  SUSPICIOUS      []  HOSTILE                            BEHAVIOR:   [x]  CALM   []  HYPERACTIVE   []   MANNERISMS      []  BIZZARE         SPEECH:   [x]  NORMAL FOR CLIENT   []  SPONTANEOUS   []  SLURRED   []  WHISPERING      []  LOUD   []  PRESSURED   []  ARTICULATE        EYE CONTACT:   [x]  WNL   []  BLANK STARE   []  INTENSE      []  AVOIDANT         MOOD:   [x]  EUTHYMIC   []  ANXIOUS   []  DEPRESSED      []  IRRITABLE   []  ANGRY   []  APATHETIC     AFFECT:   [x]  CONGRUENT WITH MOOD   []  FLAT   []  CONSTRICTED      []  INAPPROPRIATE   []  LABILE           THOUGHT PROCESS:   [x]  LOGICAL/GOAL-DIRECTED   []  FOI   []  CIRCUMSANTIAL      []  INCOHERENT   []  TANGENTIAL   []  CONCRETE      []  PERSEVERATION           ASSOCIATIONS  [x]  INTACT  []  LOOSE           THOUGHT CONTENT:                DELUSIONS  [x]  DENIES  []  GRANDIOSE  []  PERSECUTORY  []  RELIGIOUS  []  REFERENCE   HALLUCINATIONS  [x]  DENIES  []  AUDITORY  []  VISUAL  []  OLFACTORY  []  TACTILE     []  GUSTATORY  []  SOMATIC         OBSESSIONS  [x]  DENIES  []  PRESENT         SUICIDAL IDEATION  [x]  DENIES  []  PRESENT W/O PLAN  []  PRESENT W/ PLAN       HOMICIDAL IDEATION  [x]  DENIES  []  PRESENT W/O PLAN  []  PRESENT W/ PLAN           JUDGEMENT:   [x]  GOOD   []  FAIR   []  POOR   INSIGHT:   [x]  GOOD   []  FAIR   []  POOR     COGNITION:            SENSORIUM:   [x]  ALERT   []  CLOUDED   []  DROWSY     ORIENTATION:   [x]  INTACT   []  TIME:  PLACE  PERSON   RECENT & REMOTE MEMORY:   [x]  NORMAL   []  OTHER:                  ATTENTION:   []  INTACT   [x]  MILD IMPAIRMENT   []  SEVERE IMPAIRMENT     CONCENTRATION:   []  INTACT   [x]  MILD IMPAIRMENT   []  SEVERE IMPAIRMENT     LANGUAGE:   [x]  AVERAGE   []  ABOVE AVERAGE   []  BELOW AVERAGE     FUND OF KNOWLEDGE:   []  UNABLE TO ASSESS AT THIS TIME   [x]  AVERAGE   []  ABOVE AVERAGE   []  BELOW AVERAGE      []  GOOD TO EXCELLENT KNOWLEDGE OF CURRENT EVENTS   []  POOR TO NO KNLEDGE OF CURRENT EVENTS  Diagnoses/Impressions:    ICD-10-CM    1. Recurrent major depressive disorder, in partial remission  F33.41       2. Generalized anxiety disorder with panic attacks  F41.1 clonazePAM  (KLONOPIN ) 0.5 MG tablet    F41.0       3. Primary insomnia  F51.01 clonazePAM  (KLONOPIN ) 0.5 MG tablet          TREATMENT GOALS:  Symptom reduction, Medication adherence, maintain therapeutic gains    LABS/IMAGING:    []   Ordered [x]   Reviewed []   New Labs Ordered:     LAB  WBC   Date/Time Value Ref Range Status   10/14/2024 03:00 PM 11.1 4.3 - 11.1 K/uL Final     Hemoglobin   Date/Time Value Ref Range Status   10/14/2024 03:00 PM 13.4 11.7 - 15.4 g/dL Final     Hematocrit   Date/Time Value Ref Range Status   10/14/2024 03:00 PM 41.6 35.8 - 46.3 % Final     Platelets   Date/Time Value Ref Range Status   10/14/2024 03:00 PM 356 150 - 450 K/uL Final     Sodium   Date/Time Value Ref Range Status   10/22/2024 10:58 AM 138 136 - 145 mmol/L Final     Potassium   Date/Time Value Ref Range Status   10/22/2024 10:58 AM 3.9 3.5 - 5.1 mmol/L Final     Chloride   Date/Time Value Ref Range Status   10/22/2024 10:58 AM 105 98 - 107 mmol/L Final     CO2   Date/Time Value Ref Range Status   10/22/2024 10:58 AM 27 20 - 29 mmol/L Final     BUN   Date/Time Value Ref Range Status   10/22/2024 10:58 AM 5 (L) 6 - 23 MG/DL Final      ALT   Date/Time Value Ref Range Status   10/22/2024 10:58 AM 13 8 - 45 U/L Final     AST   Date/Time Value Ref Range Status   10/22/2024 10:58 AM 17 15 - 37 U/L Final     TSH   Date/Time Value Ref Range Status   09/01/2020 03:02 PM 0.818 0.450 - 4.500 uIU/mL Final       Please refer to the lab tab in the epic and care everywhere for the most recent lab results.    Plan:   Assessment & Plan  1. Major Depressive Disorder:  - She reports doing well on her current medications, including Pristiq  and Klonopin .  - The dosage of Klonopin  will be reduced from three times daily to twice daily.  - She is advised to take one dose in the morning and another in the afternoon, or alternatively, half a dose in the morning, half in the afternoon, and one at night.  - A prescription refill for Klonopin  will be sent to the pharmacy.    2. Generalized Anxiety Disorder with Panic Attacks:  - She experiences anxiety in crowded places, described as a feeling similar to a fear of heights in her hands and feet.  - She is advised to continue practicing exposure therapy and relaxation techniques, including breathing exercises and grounding techniques.  - She should gradually expose herself to feared situations to help manage her anxiety.    3. Primary Insomnia:  - She reports sleeping well without the need for additional sleep aids like Tylenol PM or melatonin.          [x]   Medication ordered: Wellbutrin , Pristiq , Klonopin  to target depression, anxiety,  insomnia.      [x]   Medication education/counseling provided to client  Medication dosage and time to take, purpose/expected benefits/risks, common side effects, lab monitoring required and reason, expected length of treatment, risk of no treatment, effects on pregnancy/nursing, financial availability discussed. Educated patient on side effects/risks/benefits of meds including cardiac arrhythmias, suicidal ideations, orthostatic hypotension, serotonin syndrome, risk of mania/hypomania  from antidepressants, withdrawals from abrupt discontinuation of meds, risk of bleeding, risk of seizures, addiction potential, memory impairment with long term use of benzos, respiratory depression, high blood pressure, dizziness, drowsiness, sedation , risk of falls, Risk & benefits discussed: including but not limited to possible off-label medication usage.      [x]  Patient encouraged to contact the clinic if experiencing any adverse reactions with medications.      [x]  Follow MSE for sxs improvement     I have reviewed the patients controlled substance prescription history, as maintained in the [place]  prescription monitoring program, so that the prescription(s) for a  controlled substance can be given.  Controlled substance agreement reviewed.  .      Recommendations and Referrals:     Follow up with : MD, requires monitoring of response to medication, requires monitoring of medication side effects.     Time until next PMA: 3 months     Follow up with Mental Health Clinicians recommended for : psychotherapy interventions,  monitoring to prevent decompensation /hospitalization, monitoring to maintain therapeutic gains, monitoring symptoms (resolving and controlled), to improve level of functioning,     Referral: None     Emergency admission to inpatient psychiatric care: N/A        Psychotherapy note:                                __10_ Minutes of psychotherapy     [x]   Supportive psychotherapy provided to improve self-esteem, psychological functioning, and adaptive skills.  Patient discussed certain situational and personal stressors ongoing in her life at this time, weight management d/w the patient. Sleep hygiene d/w patient. Patient allowed to vent out her emotions. Active listening were provided,   Scenarios were reviewed using CBT  techniques in order to increase insight and decrease anxiety.    Dysfunctional cognitions challenged and alternate options discussed. Strengths were validated and  reinforced.   Discussed working on coping skills and relaxation techniques (meditation, mindfulness, breathing exercises) to help mitigate anxiety and improve sleep. Suggested reading, listening to music, outdoor activities,  exercise as tolerated, puzzles, art (drawing, coloring, painting) to alleviate anxiety.      []   Disposition planning      []   Dangerous and will not contract for safety in the community    **[place]int has been notified: They are to call 911 or go to their nearest E.R. if they are experiencing a medical emergency or suicidal ideations/homicidal ideations.**  All ancillary documentation entered reviewed by provider.      PLEASE NOTE:  This document has been produced in part or whole using voice recognition software. Proofread however unrecognized errors in transcription may be present.    Carolann Shark, MD    Lauraine Landry Sharps, was evaluated through a synchronous (real-time) audio-video encounter. The patient (or guardian if applicable) is aware that this is a billable service, which includes applicable co-pays. This Virtual Visit was conducted with patient's (and/or legal guardian's) consent. Patient identification was verified, and a caregiver was present when appropriate.  The patient was located at Home: [address]  [place] [address]  Provider was located at Home (Appt Dept State): [place]  Confirm you are appropriately licensed, registered, or certified to deliver care in the state where the patient is located as indicated above. If you are not or unsure, please re-schedule the visit: Yes, I confirm.        Total time spent for this encounter: Not billed by time    --Carolann Shark, MD on 10/30/2024 at 8:12 PM    An electronic signature was used to authenticate this note.            Would like to discuss how she has a fear of going to the store or in large public places by herself and always needs to wait until her husband can go with her.   "

## 2024-10-30 MED ORDER — DESVENLAFAXINE SUCCINATE ER 50 MG PO TB24
50 | ORAL_TABLET | Freq: Every day | ORAL | 2 refills | Status: AC
Start: 2024-10-30 — End: 2025-02-24

## 2024-10-30 MED ORDER — CLONAZEPAM 0.5 MG PO TABS
0.5 | ORAL_TABLET | Freq: Two times a day (BID) | ORAL | 2 refills | Status: AC | PRN
Start: 2024-10-30 — End: 2025-02-24

## 2024-10-30 MED ORDER — BUPROPION HCL ER (XL) 300 MG PO TB24
300 | ORAL_TABLET | Freq: Every day | ORAL | 2 refills | Status: AC
Start: 2024-10-30 — End: 2025-02-24
